# Patient Record
Sex: Female | Born: 1955 | Race: White | Hispanic: No | Smoking: Never smoker
Health system: Southern US, Community
[De-identification: ages and names within clinical notes are randomized; demographics above are authoritative.]

## PROBLEM LIST (undated history)

## (undated) ENCOUNTER — Emergency Department (HOSPITAL_COMMUNITY): Payer: BLUE CROSS/BLUE SHIELD | Source: Home / Self Care

## (undated) DIAGNOSIS — M1990 Unspecified osteoarthritis, unspecified site: Secondary | ICD-10-CM

## (undated) DIAGNOSIS — G43909 Migraine, unspecified, not intractable, without status migrainosus: Secondary | ICD-10-CM

## (undated) DIAGNOSIS — B019 Varicella without complication: Secondary | ICD-10-CM

## (undated) DIAGNOSIS — K900 Celiac disease: Secondary | ICD-10-CM

## (undated) DIAGNOSIS — E785 Hyperlipidemia, unspecified: Secondary | ICD-10-CM

## (undated) HISTORY — DX: Varicella without complication: B01.9

## (undated) HISTORY — DX: Migraine, unspecified, not intractable, without status migrainosus: G43.909

## (undated) HISTORY — DX: Celiac disease: K90.0

## (undated) HISTORY — DX: Hyperlipidemia, unspecified: E78.5

## (undated) HISTORY — DX: Unspecified osteoarthritis, unspecified site: M19.90

---
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
## 1998-09-17 ENCOUNTER — Other Ambulatory Visit: Admission: RE | Admit: 1998-09-17 | Discharge: 1998-09-17 | Payer: Self-pay | Admitting: *Deleted

## 1999-11-25 ENCOUNTER — Other Ambulatory Visit: Admission: RE | Admit: 1999-11-25 | Discharge: 1999-11-25 | Payer: Self-pay | Admitting: *Deleted

## 2000-11-26 ENCOUNTER — Other Ambulatory Visit: Admission: RE | Admit: 2000-11-26 | Discharge: 2000-11-26 | Payer: Self-pay | Admitting: *Deleted

## 2001-12-03 ENCOUNTER — Other Ambulatory Visit: Admission: RE | Admit: 2001-12-03 | Discharge: 2001-12-03 | Payer: Self-pay | Admitting: Obstetrics and Gynecology

## 2003-01-10 ENCOUNTER — Other Ambulatory Visit: Admission: RE | Admit: 2003-01-10 | Discharge: 2003-01-10 | Payer: Self-pay | Admitting: Obstetrics and Gynecology

## 2003-07-17 ENCOUNTER — Encounter: Admission: RE | Admit: 2003-07-17 | Discharge: 2003-10-15 | Payer: Self-pay | Admitting: Gastroenterology

## 2004-01-16 ENCOUNTER — Other Ambulatory Visit: Admission: RE | Admit: 2004-01-16 | Discharge: 2004-01-16 | Payer: Self-pay | Admitting: Obstetrics and Gynecology

## 2004-09-29 HISTORY — PX: CARPAL TUNNEL RELEASE: SHX101

## 2004-12-09 ENCOUNTER — Encounter: Admission: RE | Admit: 2004-12-09 | Discharge: 2004-12-09 | Payer: Self-pay | Admitting: Obstetrics and Gynecology

## 2004-12-10 ENCOUNTER — Ambulatory Visit (HOSPITAL_COMMUNITY): Admission: RE | Admit: 2004-12-10 | Discharge: 2004-12-10 | Payer: Self-pay | Admitting: Neurosurgery

## 2005-02-19 ENCOUNTER — Ambulatory Visit (HOSPITAL_COMMUNITY): Admission: RE | Admit: 2005-02-19 | Discharge: 2005-02-20 | Payer: Self-pay | Admitting: Neurosurgery

## 2005-03-28 ENCOUNTER — Ambulatory Visit (HOSPITAL_COMMUNITY): Admission: RE | Admit: 2005-03-28 | Discharge: 2005-03-28 | Payer: Self-pay | Admitting: Neurosurgery

## 2013-09-29 HISTORY — PX: RETINAL TEAR REPAIR CRYOTHERAPY: SHX5304

## 2015-07-02 ENCOUNTER — Other Ambulatory Visit: Payer: Self-pay | Admitting: Obstetrics & Gynecology

## 2015-07-02 DIAGNOSIS — Z1239 Encounter for other screening for malignant neoplasm of breast: Secondary | ICD-10-CM

## 2015-07-12 ENCOUNTER — Ambulatory Visit
Admission: RE | Admit: 2015-07-12 | Discharge: 2015-07-12 | Disposition: A | Discharge status: Home / Self Care | Payer: BLUE CROSS/BLUE SHIELD | Source: Ambulatory Visit | Attending: Obstetrics & Gynecology | Admitting: Obstetrics & Gynecology

## 2015-07-12 DIAGNOSIS — Z1231 Encounter for screening mammogram for malignant neoplasm of breast: Secondary | ICD-10-CM | POA: Insufficient documentation

## 2015-07-12 DIAGNOSIS — Z1239 Encounter for other screening for malignant neoplasm of breast: Secondary | ICD-10-CM

## 2016-05-07 ENCOUNTER — Encounter: Payer: Self-pay | Admitting: Primary Care

## 2016-05-07 ENCOUNTER — Ambulatory Visit (INDEPENDENT_AMBULATORY_CARE_PROVIDER_SITE_OTHER): Payer: BLUE CROSS/BLUE SHIELD | Admitting: Primary Care

## 2016-05-07 VITALS — BP 124/84 | HR 82 | Temp 97.9°F | Ht 63.0 in | Wt 158.8 lb

## 2016-05-07 DIAGNOSIS — R5383 Other fatigue: Secondary | ICD-10-CM | POA: Diagnosis not present

## 2016-05-07 DIAGNOSIS — K900 Celiac disease: Secondary | ICD-10-CM | POA: Diagnosis not present

## 2016-05-07 DIAGNOSIS — E785 Hyperlipidemia, unspecified: Secondary | ICD-10-CM

## 2016-05-07 NOTE — Assessment & Plan Note (Signed)
Diagnosed in 2004 and is managed by GI through [facility] and also a specialist through [facility]. Must have gluten-free medications, cannot take steroids. Follows a strict gluten diet.

## 2016-05-07 NOTE — Progress Notes (Signed)
Subjective:    Patient ID: Paula Gray, female    DOB: 09-11-1956, 60 y.o.   MRN: [id number]  HPI  Paula Gray is a 60 year old female who presents today to establish care and discuss the problems mentioned below. Will obtain old records. Her last physical was in May 2016.  1) Hyperlipidemia: Diagnosed in May 2016. Currently managed on Atorvastatin 20 mg for which she now takes 1/2 tablet (10 mg) as the full tablet caused dizziness.  2) Celiac Disease: Diagnosed in 2004. She currently manages with hematology and GI through [facility]. She also see's a specialist through [facility] for Celiac Disease. She follows a Celiac controlled diet. She experiences fatigue daily, some days more drastic than others. She does work in a high stress occupation which could likely be contributing. Denies symptoms of depression. She will sleep 6-8 hours of sleep most nights and will sometimes feel well rested. Denies snoring, waking up gasping for air in the night, family history of sleep apnea.  3) Anemia: History of iron deficiency anemia secondary to celiac disease. She will require IV iron infusions for which she will obtain from [facility]. Recent CBC and Iron panel stable in August 2017.  Review of Systems  Constitutional: Positive for fatigue.  Cardiovascular: Negative for palpitations.  Gastrointestinal: Negative for abdominal pain.  Musculoskeletal: Negative for myalgias.  Neurological: Negative for dizziness and headaches.  Psychiatric/Behavioral: Negative for sleep disturbance. The patient is not nervous/anxious.        Past Medical History:  Diagnosis Date  . Arthritis   . Celiac disease   . Chickenpox   . Hyperlipidemia   . Migraine      Social History   Social History  . Marital status: Married    Spouse name: N/A  . Number of children: N/A  . Years of education: N/A   Occupational History  . Not on file.   Social History Main Topics  . Smoking status: Never Smoker  .  Smokeless tobacco: Never Used  . Alcohol use Yes     Comment: social  . Drug use: No  . Sexual activity: Not on file   Other Topics Concern  . Not on file   Social History Narrative   Single.    2 children. 1 grandchild.   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     Past Surgical History:  Procedure Laterality Date  . CARPAL TUNNEL RELEASE  2006  . CESAREAN SECTION  1985  . RETINAL TEAR REPAIR CRYOTHERAPY Left 2015  . TONSILLECTOMY  1962    Family History  Problem Relation Age of Onset  . Breast cancer Maternal Aunt     great aunt  . Arthritis Maternal Grandmother   . Heart disease Maternal Grandfather   . Arthritis Paternal Grandmother   . Colon cancer Paternal Grandfather   . Skin cancer Father     Allergies  Allergen Reactions  . Iron Dextran     No current outpatient prescriptions on file prior to visit.   No current facility-administered medications on file prior to visit.     BP 124/84   Pulse 82   Temp 97.9 F (36.6 C) (Oral)   Ht 5\' 3"  (1.6 m)   Wt 158 lb 12.8 oz (72 kg)   SpO2 98%   BMI 28.13 kg/m    Objective:   Physical Exam  Constitutional: She appears well-nourished.  Neck: Neck supple.  Cardiovascular: Normal  rate and regular rhythm.   Pulmonary/Chest: Effort normal and breath sounds normal.  Skin: Skin is warm and dry.  Psychiatric: She has a normal mood and affect.          Assessment & Plan:  Fatigue:  Ongoing for several months, works in a high stress job. History of celiac disease requiring IV iron transfusions due to anemia secondary to disease. TSH 1 year ago stable. Does not seem to be related to mood disorder or sleep apnea. Will check TSH and vitamin levels to rule out metabolic cause. CBC in early August unremarkable per care everywhere. Likely due to high stress job, discussed stress reduction techniques.  Sheral Flow, NP

## 2016-05-07 NOTE — Assessment & Plan Note (Signed)
Diagnosis 1 year ago and placed on atorvastatin 20 mg. She takes one half tablet due to dizziness with full dose. Will repeat lipids and LFTs at upcoming physical.

## 2016-05-07 NOTE — Patient Instructions (Signed)
Complete lab work prior to leaving today. I will notify you of your results once received.   Please schedule a physical with me at your convenience. You may also schedule a lab only appointment 3-4 days prior. We will discuss your lab results in detail during your physical.  It was a pleasure to meet you today! Please don't hesitate to call me with any questions. Welcome to [place]!   

## 2016-05-07 NOTE — Progress Notes (Signed)
Pre visit review using our clinic review tool, if applicable. No additional management support is needed unless otherwise documented below in the visit note. 

## 2016-05-08 ENCOUNTER — Telehealth: Payer: Self-pay | Admitting: Primary Care

## 2016-05-08 ENCOUNTER — Encounter: Payer: Self-pay | Admitting: *Deleted

## 2016-05-08 LAB — COMPREHENSIVE METABOLIC PANEL
ALK PHOS: 71 U/L (ref 39–117)
ALT: 21 U/L (ref 0–35)
AST: 15 U/L (ref 0–37)
Albumin: 4.8 g/dL (ref 3.5–5.2)
BUN: 17 mg/dL (ref 6–23)
CALCIUM: 9.9 mg/dL (ref 8.4–10.5)
CHLORIDE: 103 meq/L (ref 96–112)
CO2: 30 mEq/L (ref 19–32)
CREATININE: 0.83 mg/dL (ref 0.40–1.20)
GFR: 74.83 mL/min (ref >60.00)
Glucose, Bld: 101 mg/dL — ABNORMAL HIGH (ref 70–99)
POTASSIUM: 4.6 meq/L (ref 3.5–5.1)
Sodium: 139 mEq/L (ref 135–145)
Total Bilirubin: 0.7 mg/dL (ref 0.2–1.2)
Total Protein: 7.6 g/dL (ref 6.0–8.3)

## 2016-05-08 LAB — TSH: TSH: 3.54 u[IU]/mL (ref 0.35–4.50)

## 2016-05-08 LAB — VITAMIN B12: VITAMIN B 12: 464 pg/mL (ref 211–911)

## 2016-05-08 LAB — VITAMIN D 25 HYDROXY (VIT D DEFICIENCY, FRACTURES): VITD: 20.52 ng/mL — AB (ref 30.00–100.00)

## 2016-05-08 NOTE — Telephone Encounter (Signed)
Notified patient of Kate's comments and lab results. Patient verbalized understanding.

## 2016-05-08 NOTE — Telephone Encounter (Signed)
Patient returned Chan's call. °

## 2016-05-23 ENCOUNTER — Ambulatory Visit (INDEPENDENT_AMBULATORY_CARE_PROVIDER_SITE_OTHER): Payer: BLUE CROSS/BLUE SHIELD | Admitting: Primary Care

## 2016-05-23 ENCOUNTER — Other Ambulatory Visit: Payer: Self-pay | Admitting: Primary Care

## 2016-05-23 ENCOUNTER — Encounter: Payer: Self-pay | Admitting: Primary Care

## 2016-05-23 VITALS — BP 122/84 | HR 78 | Temp 97.5°F | Ht 63.0 in | Wt 157.1 lb

## 2016-05-23 DIAGNOSIS — Z23 Encounter for immunization: Secondary | ICD-10-CM | POA: Diagnosis not present

## 2016-05-23 DIAGNOSIS — Z0000 Encounter for general adult medical examination without abnormal findings: Secondary | ICD-10-CM | POA: Diagnosis not present

## 2016-05-23 DIAGNOSIS — E785 Hyperlipidemia, unspecified: Secondary | ICD-10-CM

## 2016-05-23 LAB — LDL CHOLESTEROL, DIRECT: LDL DIRECT: 118 mg/dL

## 2016-05-23 LAB — LIPID PANEL
CHOL/HDL RATIO: 5
Cholesterol: 190 mg/dL (ref 0–200)
HDL: 37.5 mg/dL — ABNORMAL LOW (ref >39.00)
NonHDL: 152.52
Triglycerides: 212 mg/dL — ABNORMAL HIGH (ref 0.0–149.0)
VLDL: 42.4 mg/dL — AB (ref 0.0–40.0)

## 2016-05-23 MED ORDER — ATORVASTATIN CALCIUM 20 MG PO TABS: 10.0000 mg | ORAL_TABLET | Freq: Every day | ORAL | 3 refills | Status: DC

## 2016-05-23 NOTE — Progress Notes (Signed)
Pre visit review using our clinic review tool, if applicable. No additional management support is needed unless otherwise documented below in the visit note. 

## 2016-05-23 NOTE — Assessment & Plan Note (Signed)
Tetanus due, provided today. Pap UTD, follows with GYN. Mammogram due in October, patient to schedule. Colonoscopy UTD. Overall fair diet. Encouraged regular exercise. Exam unremarkable. Labs pending. Follow up in 1 year for repeat physical.

## 2016-05-23 NOTE — Progress Notes (Signed)
Subjective:    Patient ID: Paula Gray, female    DOB: August 04, 1956, 60 y.o.   MRN: [id number]  HPI  Paula Gray is a 60 year old female who presents today for complete physical.  Immunizations: -Tetanus: Completed over 10 years ago. Due today. -Influenza: Did not complete last season.  Diet: Endorses a healthy diet.  Breakfast: Oatmeal with Agave, coffee Lunch: Fast food, salad Dinner: Vegetables, chicken, fish, occasional red meat Snacks: Fruit Desserts: Occasionally Beverages: Water, coffee, unsweet tea, [place]  Exercise: She does not currently exercise, active in her home Eye exam: Completed 1 year ago, sees a retina specialist Dental exam: Completes semi-annually Colonoscopy: Completed 2 years ago, history of Celiac disease Pap Smear: Completed 2 years ago, follows with GYN. Mammogram: Completed in 2016, due in October 2017   Review of Systems  Constitutional: Negative for unexpected weight change.  HENT: Negative for rhinorrhea.   Respiratory: Negative for cough and shortness of breath.   Cardiovascular: Negative for chest pain.  Gastrointestinal: Negative for constipation and diarrhea.  Genitourinary: Negative for difficulty urinating.  Musculoskeletal: Negative for arthralgias and myalgias.  Skin: Negative for rash.  Allergic/Immunologic: Negative for environmental allergies.  Neurological: Negative for dizziness, numbness and headaches.  Psychiatric/Behavioral:       Denies concerns for anxiety or depression       Past Medical History:  Diagnosis Date  . Arthritis   . Celiac disease   . Chickenpox   . Hyperlipidemia   . Migraine      Social History   Social History  . Marital status: Married    Spouse name: N/A  . Number of children: N/A  . Years of education: N/A   Occupational History  . Not on file.   Social History Main Topics  . Smoking status: Never Smoker  . Smokeless tobacco: Never Used  . Alcohol use Yes     Comment: social  .  Drug use: No  . Sexual activity: Not on file   Other Topics Concern  . Not on file   Social History Narrative   Single.    2 children. 1 grandchild.   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     Past Surgical History:  Procedure Laterality Date  . CARPAL TUNNEL RELEASE  2006  . CESAREAN SECTION  1985  . RETINAL TEAR REPAIR CRYOTHERAPY Left 2015  . TONSILLECTOMY  1962    Family History  Problem Relation Age of Onset  . Breast cancer Maternal Aunt     great aunt  . Arthritis Maternal Grandmother   . Heart disease Maternal Grandfather   . Arthritis Paternal Grandmother   . Colon cancer Paternal Grandfather   . Skin cancer Father     Allergies  Allergen Reactions  . Iron Dextran   . Prednisone Other (See Comments)    History of celiac disease    Current Outpatient Prescriptions on File Prior to Visit  Medication Sig Dispense Refill  . atorvastatin (LIPITOR) 20 MG tablet Take 10 mg by mouth daily.     . Cholecalciferol (VITAMIN D3) 1000 units CAPS Take by mouth.    . CHONDROITIN SULFATE PO Take 1,200 mg by mouth daily.    . Glucosamine-Chondroit-Vit C-Mn (GLUCOSAMINE 1500 COMPLEX PO) Take by mouth.     No current facility-administered medications on file prior to visit.     BP 122/84   Pulse 78   Temp 97.5 F (36.4 C) (Oral)  Ht 5\' 3"  (1.6 m)   Wt 157 lb 1.9 oz (71.3 kg)   SpO2 98%   BMI 27.83 kg/m    Objective:   Physical Exam  Constitutional: She is oriented to person, place, and time. She appears well-nourished.  HENT:  Right Ear: Tympanic membrane and ear canal normal.  Left Ear: Tympanic membrane and ear canal normal.  Nose: Nose normal.  Mouth/Throat: Oropharynx is clear and moist.  Eyes: Conjunctivae and EOM are normal. Pupils are equal, round, and reactive to light.  Neck: Neck supple. No thyromegaly present.  Cardiovascular: Normal rate and regular rhythm.   No murmur heard. Pulmonary/Chest: Effort normal and  breath sounds normal. She has no rales.  Abdominal: Soft. Bowel sounds are normal. There is no tenderness.  Musculoskeletal: Normal range of motion.  Lymphadenopathy:    She has no cervical adenopathy.  Neurological: She is alert and oriented to person, place, and time. She has normal reflexes. No cranial nerve deficit.  Skin: Skin is warm and dry. No rash noted.  Psychiatric: She has a normal mood and affect.          Assessment & Plan:

## 2016-05-23 NOTE — Addendum Note (Signed)
Addended by: Jacqualin Combes on: 05/23/2016 10:07 AM   Modules accepted: Orders

## 2016-05-23 NOTE — Assessment & Plan Note (Signed)
Managed on atorvastatin (gluten free) 20 mg and takes 1/2 tablet. Needing refills, labs pending. Denies myalgias.

## 2016-05-23 NOTE — Patient Instructions (Addendum)
Complete lab work prior to leaving today. I will notify you of your results once received.   You were provided with a tetanus vaccination which will cover you for 10 years.  Continue your healthy diet. Start exercising. You should be getting 1 hour of moderate intensity exercise 3-5 days weekly.  Schedule your mammogram in October.   Schedule a lab only appointment in 3 months for re-evaluation of vitamin D levels.  Follow up in 1 year for repeat physical or sooner if needed.  It was a pleasure to see you today!

## 2016-07-15 ENCOUNTER — Telehealth: Payer: Self-pay

## 2016-07-15 DIAGNOSIS — R682 Dry mouth, unspecified: Secondary | ICD-10-CM

## 2016-07-15 DIAGNOSIS — H04123 Dry eye syndrome of bilateral lacrimal glands: Secondary | ICD-10-CM

## 2016-07-15 NOTE — Telephone Encounter (Signed)
Pt having dryness in mouth, gums shrinking,(pt has seen a dentist),dry eyes and skin is dry as well. Pt wanted to know if [facility] lab could do testing for Sjorgren's syndrome. Terri in lab said yes they do that type testing. Pt would like note sent to Anda Kraft since had this problem for awhile and pt had annual 05/23/16. Pt request cb.

## 2016-07-15 NOTE — Telephone Encounter (Signed)
Please find out more information:  1. How long has she had this problem? 2. What did the dentist say during her visit? 3. Has she had any testing for this in the past? 4. Has anyone every suggested Sjogrens as a diagnosis?

## 2016-07-16 NOTE — Telephone Encounter (Signed)
Lab appt has been schedule on 07/21/2016.

## 2016-07-16 NOTE — Telephone Encounter (Signed)
Please notify patient that i've placed orders for her to come in a get labs.  I will notify her of the results once received.

## 2016-07-16 NOTE — Telephone Encounter (Signed)
Called patient.  1. This problem has be going on years.  2. The dentist stated that patient may be brushing too hard and it may lead to gum shrinking. Patient did have gum surgery to help increase gum line but it has decrease some since then.  3. No, she never had any testing done.  4. Someone in her support group with similar symptoms suggested the test for Sjgren's syndrome.  Patient stated that she is also been more tired even with supplement of vitamin D. Patient is having trouble sleeping.

## 2016-07-16 NOTE — Telephone Encounter (Signed)
Patient called.  Please call patient back at [phone].

## 2016-07-21 ENCOUNTER — Other Ambulatory Visit (INDEPENDENT_AMBULATORY_CARE_PROVIDER_SITE_OTHER): Payer: BLUE CROSS/BLUE SHIELD

## 2016-07-21 ENCOUNTER — Other Ambulatory Visit: Payer: Self-pay | Admitting: Obstetrics & Gynecology

## 2016-07-21 DIAGNOSIS — H04123 Dry eye syndrome of bilateral lacrimal glands: Secondary | ICD-10-CM | POA: Diagnosis not present

## 2016-07-21 DIAGNOSIS — R682 Dry mouth, unspecified: Secondary | ICD-10-CM | POA: Diagnosis not present

## 2016-07-21 DIAGNOSIS — Z1231 Encounter for screening mammogram for malignant neoplasm of breast: Secondary | ICD-10-CM

## 2016-07-22 LAB — CBC WITH DIFFERENTIAL/PLATELET
BASOS ABS: 0.1 10*3/uL (ref 0.0–0.1)
Basophils Relative: 1.1 % (ref 0.0–3.0)
EOS ABS: 0.3 10*3/uL (ref 0.0–0.7)
Eosinophils Relative: 4.3 % (ref 0.0–5.0)
HEMATOCRIT: 39.3 % (ref 36.0–46.0)
HEMOGLOBIN: 13.4 g/dL (ref 12.0–15.0)
LYMPHS PCT: 46 % (ref 12.0–46.0)
Lymphs Abs: 2.8 10*3/uL (ref 0.7–4.0)
MCHC: 34.1 g/dL (ref 30.0–36.0)
MCV: 91 fl (ref 78.0–100.0)
Monocytes Absolute: 0.4 10*3/uL (ref 0.1–1.0)
Monocytes Relative: 6.4 % (ref 3.0–12.0)
NEUTROS ABS: 2.6 10*3/uL (ref 1.4–7.7)
Neutrophils Relative %: 42.2 % — ABNORMAL LOW (ref 43.0–77.0)
PLATELETS: 184 10*3/uL (ref 150.0–400.0)
RBC: 4.32 Mil/uL (ref 3.87–5.11)
RDW: 13 % (ref 11.5–15.5)
WBC: 6.1 10*3/uL (ref 4.0–10.5)

## 2016-07-22 LAB — SEDIMENTATION RATE: Sed Rate: 4 mm/h (ref 0–30)

## 2016-07-28 ENCOUNTER — Other Ambulatory Visit: Payer: Self-pay | Admitting: *Deleted

## 2016-07-28 MED ORDER — ATORVASTATIN CALCIUM 20 MG PO TABS: 10.0000 mg | ORAL_TABLET | Freq: Every day | ORAL | 2 refills | Status: DC

## 2016-07-28 NOTE — Telephone Encounter (Signed)
Received faxed request from pharmacy stating that patient is requesting a 90 day supply of Atorvastatin. Refill sent to pharmacy electronically,

## 2016-08-05 ENCOUNTER — Ambulatory Visit
Admission: RE | Admit: 2016-08-05 | Discharge: 2016-08-05 | Disposition: A | Discharge status: Home / Self Care | Payer: BLUE CROSS/BLUE SHIELD | Source: Ambulatory Visit | Attending: Obstetrics & Gynecology | Admitting: Obstetrics & Gynecology

## 2016-08-05 DIAGNOSIS — Z1231 Encounter for screening mammogram for malignant neoplasm of breast: Secondary | ICD-10-CM | POA: Insufficient documentation

## 2017-03-06 ENCOUNTER — Other Ambulatory Visit: Payer: Self-pay

## 2017-03-06 MED ORDER — ATORVASTATIN CALCIUM 20 MG PO TABS: 10.0000 mg | ORAL_TABLET | Freq: Every day | ORAL | 0 refills | Status: DC

## 2017-03-06 NOTE — Telephone Encounter (Signed)
Pt has changed prescription companies and request new rx for atorvastatin to [facility].pt notified done. Last annual 05/26/16 and pt will cb to schedule annual exam.

## 2017-06-12 ENCOUNTER — Encounter: Payer: BLUE CROSS/BLUE SHIELD | Admitting: Primary Care

## 2017-06-19 ENCOUNTER — Ambulatory Visit (INDEPENDENT_AMBULATORY_CARE_PROVIDER_SITE_OTHER): Payer: BLUE CROSS/BLUE SHIELD | Admitting: Primary Care

## 2017-06-19 ENCOUNTER — Encounter: Payer: Self-pay | Admitting: Primary Care

## 2017-06-19 VITALS — BP 120/84 | HR 78 | Temp 97.8°F | Ht 63.0 in | Wt 151.4 lb

## 2017-06-19 DIAGNOSIS — D509 Iron deficiency anemia, unspecified: Secondary | ICD-10-CM

## 2017-06-19 DIAGNOSIS — K900 Celiac disease: Secondary | ICD-10-CM

## 2017-06-19 DIAGNOSIS — Z0000 Encounter for general adult medical examination without abnormal findings: Secondary | ICD-10-CM

## 2017-06-19 DIAGNOSIS — E785 Hyperlipidemia, unspecified: Secondary | ICD-10-CM | POA: Diagnosis not present

## 2017-06-19 LAB — COMPREHENSIVE METABOLIC PANEL
ALK PHOS: 54 U/L (ref 39–117)
ALT: 21 U/L (ref 0–35)
AST: 18 U/L (ref 0–37)
Albumin: 4.3 g/dL (ref 3.5–5.2)
BILIRUBIN TOTAL: 0.5 mg/dL (ref 0.2–1.2)
BUN: 16 mg/dL (ref 6–23)
CO2: 29 mEq/L (ref 19–32)
Calcium: 9.6 mg/dL (ref 8.4–10.5)
Chloride: 107 mEq/L (ref 96–112)
Creatinine, Ser: 0.85 mg/dL (ref 0.40–1.20)
GFR: 72.52 mL/min (ref >60.00)
GLUCOSE: 121 mg/dL — AB (ref 70–99)
Potassium: 4.7 mEq/L (ref 3.5–5.1)
SODIUM: 141 meq/L (ref 135–145)
TOTAL PROTEIN: 6.8 g/dL (ref 6.0–8.3)

## 2017-06-19 LAB — LIPID PANEL
Cholesterol: 173 mg/dL (ref 0–200)
HDL: 43.6 mg/dL (ref >39.00)
LDL Cholesterol: 99 mg/dL (ref 0–99)
NONHDL: 129.89
Total CHOL/HDL Ratio: 4
Triglycerides: 156 mg/dL — ABNORMAL HIGH (ref 0.0–149.0)
VLDL: 31.2 mg/dL (ref 0.0–40.0)

## 2017-06-19 LAB — CBC
HCT: 42.8 % (ref 36.0–46.0)
Hemoglobin: 14.6 g/dL (ref 12.0–15.0)
MCHC: 34 g/dL (ref 30.0–36.0)
MCV: 92 fl (ref 78.0–100.0)
PLATELETS: 209 10*3/uL (ref 150.0–400.0)
RBC: 4.66 Mil/uL (ref 3.87–5.11)
RDW: 13.1 % (ref 11.5–15.5)
WBC: 5.4 10*3/uL (ref 4.0–10.5)

## 2017-06-19 LAB — VITAMIN D 25 HYDROXY (VIT D DEFICIENCY, FRACTURES): VITD: 33.59 ng/mL (ref 30.00–100.00)

## 2017-06-19 LAB — FERRITIN: FERRITIN: 101.9 ng/mL (ref 10.0–291.0)

## 2017-06-19 LAB — VITAMIN B12: Vitamin B-12: >1500 pg/mL — ABNORMAL HIGH (ref 211–911)

## 2017-06-19 NOTE — Assessment & Plan Note (Signed)
Stable, following with GI through [facility].

## 2017-06-19 NOTE — Progress Notes (Signed)
Subjective:    Patient ID: Paula Gray, female    DOB: [birth year], 61 y.o.   MRN: [id number]  HPI  [name] is a 61 year old female who presents today for complete physical.  Immunizations: -Tetanus: Completed in 2017 -Influenza: Will get at work  Overall doing well except for chronic fatigue. Is requesting IM vitamin B 12 injections. Typically gets iron infusions per [facility] hematology, last ferritin was stable in July 2018 therefore no infusion was administered. She is wanting IM B 12 to "hold her over" until January when she can afford another infusion. Currently taking oral B 12 1000 mcg daily.   Diet: She endorses a healthy diet. Breakfast: Oatmeal, eggs Lunch: Unsure Dinner: Meat, vegetable Snacks: Cheese stick, fruit, yogurt Desserts: Occasionally Beverages: Water, diet soda, coffee  Exercise: She does not currently exercise.  Eye exam: Completed 1-2 years ago. Dental exam: Completes annually Colonoscopy: Completed in 2014 Pap Smear: Unsure, following with GYN.  Mammogram: Completed in November 2017    Review of Systems  Constitutional: Negative for unexpected weight change.       Chronic fatigue  HENT: Negative for rhinorrhea.   Respiratory: Negative for cough and shortness of breath.   Cardiovascular: Negative for chest pain.  Gastrointestinal: Negative for constipation and diarrhea.  Genitourinary: Negative for difficulty urinating and menstrual problem.  Musculoskeletal: Negative for arthralgias and myalgias.  Skin: Negative for rash.  Allergic/Immunologic: Negative for environmental allergies.  Neurological: Negative for dizziness, numbness and headaches.  Psychiatric/Behavioral:       Denies concerns for anxiety or depression       Past Medical History:  Diagnosis Date  . Arthritis   . Celiac disease   . Chickenpox   . Hyperlipidemia   . Migraine      Social History   Social History  . Marital status: Married    Spouse name: N/A  .  Number of children: N/A  . Years of education: N/A   Occupational History  . Not on file.   Social History Main Topics  . Smoking status: Never Smoker  . Smokeless tobacco: Never Used  . Alcohol use Yes     Comment: social  . Drug use: No  . Sexual activity: Not on file   Other Topics Concern  . Not on file   Social History Narrative   Single.    2 children. 1 grandchild.   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     Past Surgical History:  Procedure Laterality Date  . CARPAL TUNNEL RELEASE  2006  . CESAREAN SECTION  1985  . RETINAL TEAR REPAIR CRYOTHERAPY Left 2015  . TONSILLECTOMY  1962    Family History  Problem Relation Age of Onset  . Breast cancer Maternal Aunt        great aunt  . Arthritis Maternal Grandmother   . Heart disease Maternal Grandfather   . Arthritis Paternal Grandmother   . Colon cancer Paternal Grandfather   . Skin cancer Father     Allergies  Allergen Reactions  . Iron Dextran   . Prednisone Other (See Comments)    History of celiac disease    Current Outpatient Prescriptions on File Prior to Visit  Medication Sig Dispense Refill  . atorvastatin (LIPITOR) 20 MG tablet Take 0.5 tablets (10 mg total) by mouth daily. 45 tablet 0  . Cholecalciferol (VITAMIN D3) 1000 units CAPS Take by mouth.    . CHONDROITIN SULFATE  PO Take 1,200 mg by mouth daily.    . Glucosamine-Chondroit-Vit C-Mn (GLUCOSAMINE 1500 COMPLEX PO) Take by mouth.     No current facility-administered medications on file prior to visit.     BP 120/84   Pulse 78   Temp 97.8 F (36.6 C) (Oral)   Ht 5\' 3"  (1.6 m)   Wt 151 lb 6.4 oz (68.7 kg)   SpO2 99%   BMI 26.82 kg/m    Objective:   Physical Exam  Constitutional: She is oriented to person, place, and time. She appears well-nourished.  HENT:  Right Ear: Tympanic membrane and ear canal normal.  Left Ear: Tympanic membrane and ear canal normal.  Nose: Nose normal.  Mouth/Throat:  Oropharynx is clear and moist.  Eyes: Pupils are equal, round, and reactive to light. Conjunctivae and EOM are normal.  Neck: Neck supple. No thyromegaly present.  Cardiovascular: Normal rate and regular rhythm.   No murmur heard. Pulmonary/Chest: Effort normal and breath sounds normal. She has no rales.  Abdominal: Soft. Bowel sounds are normal. There is no tenderness.  Musculoskeletal: Normal range of motion.  Lymphadenopathy:    She has no cervical adenopathy.  Neurological: She is alert and oriented to person, place, and time. She has normal reflexes. No cranial nerve deficit.  Skin: Skin is warm and dry. No rash noted.  Psychiatric: She has a normal mood and affect.          Assessment & Plan:

## 2017-06-19 NOTE — Assessment & Plan Note (Signed)
Following with hematology through [facility], last Ferritin level too high for iron infusion. Discussed that we will need to check B 12 levels today prior to discussing IM B 12. Also check CBC and ferritin.

## 2017-06-19 NOTE — Assessment & Plan Note (Signed)
Td UTD, will get influenza vaccination through work. Pap and mammogram UTD. Colonoscopy UTD.  Discussed the importance of a healthy diet and regular exercise in order for weight loss, and to reduce the risk of other medical problems. Exam unremarkable. Labs pending. Follow up in 1 year.

## 2017-06-19 NOTE — Patient Instructions (Signed)
Complete lab work prior to leaving today. I will notify you of your results once received.   Start exercising. You should be getting 150 minutes of moderate intensity exercise weekly.  Increase consumption of vegetables and fruit.  Ensure you are consuming 64 ounces of water daily.  Follow up in 1 year for your annual exam or sooner if needed.  It was a pleasure to see you today!

## 2017-06-19 NOTE — Assessment & Plan Note (Signed)
Due for repeat lipids today, continue gluten free atorvastatin.

## 2017-06-22 ENCOUNTER — Other Ambulatory Visit (INDEPENDENT_AMBULATORY_CARE_PROVIDER_SITE_OTHER): Payer: BLUE CROSS/BLUE SHIELD

## 2017-06-22 DIAGNOSIS — R739 Hyperglycemia, unspecified: Secondary | ICD-10-CM

## 2017-06-22 LAB — HEMOGLOBIN A1C: HEMOGLOBIN A1C: 6.1 % (ref 4.6–6.5)

## 2017-06-24 ENCOUNTER — Other Ambulatory Visit: Payer: Self-pay | Admitting: Primary Care

## 2017-07-21 ENCOUNTER — Other Ambulatory Visit: Payer: Self-pay | Admitting: Obstetrics & Gynecology

## 2017-07-21 DIAGNOSIS — Z1231 Encounter for screening mammogram for malignant neoplasm of breast: Secondary | ICD-10-CM

## 2017-09-09 ENCOUNTER — Ambulatory Visit
Admission: RE | Admit: 2017-09-09 | Discharge: 2017-09-09 | Disposition: A | Discharge status: Home / Self Care | Payer: BLUE CROSS/BLUE SHIELD | Source: Ambulatory Visit | Attending: Obstetrics & Gynecology | Admitting: Obstetrics & Gynecology

## 2017-09-09 DIAGNOSIS — Z1231 Encounter for screening mammogram for malignant neoplasm of breast: Secondary | ICD-10-CM | POA: Insufficient documentation

## 2017-09-16 DIAGNOSIS — Z01419 Encounter for gynecological examination (general) (routine) without abnormal findings: Secondary | ICD-10-CM | POA: Diagnosis not present

## 2017-09-16 DIAGNOSIS — Z6826 Body mass index (BMI) 26.0-26.9, adult: Secondary | ICD-10-CM | POA: Diagnosis not present

## 2017-09-24 ENCOUNTER — Telehealth: Payer: Self-pay | Admitting: Primary Care

## 2017-09-24 DIAGNOSIS — D509 Iron deficiency anemia, unspecified: Secondary | ICD-10-CM

## 2017-09-24 DIAGNOSIS — R7303 Prediabetes: Secondary | ICD-10-CM

## 2017-09-24 NOTE — Telephone Encounter (Signed)
Any hematologist within the [facility] [facility]. We work closely with their team. Lab cancelled for A1C.

## 2017-09-24 NOTE — Telephone Encounter (Signed)
Spoken to patient. She states that she is not sure if her insurance will pay for the A1C so she would like to hold off on that.  Lab appt on 12/28 for the ferritin. Also patient wanted to know if Anda Kraft have nay suggestion for a hemologist in the [place] area. Please advise.

## 2017-09-24 NOTE — Telephone Encounter (Signed)
Okay to recheck ferritin. Please ask her if she's okay if I also check an A1C as she was in the prediabetic range in September 2018. Please schedule her for a lab only appointment.

## 2017-09-24 NOTE — Telephone Encounter (Signed)
Copied from [place] [id number]. Topic: Quick Communication - See Telephone Encounter >> Sep 24, 2017  8:36 AM Clack, Laban Emperor wrote: CRM for notification. See Telephone encounter for: Pt would like her PCP Clark to put a lab order in  to have her ferritin tested.  Please f/u with pt.  09/24/17.

## 2017-09-24 NOTE — Telephone Encounter (Signed)
Pt last annual and ferritin tested on 06/19/17; ferritin level 101.9.Please advise.

## 2017-09-25 ENCOUNTER — Other Ambulatory Visit: Payer: BLUE CROSS/BLUE SHIELD

## 2017-09-28 ENCOUNTER — Other Ambulatory Visit (INDEPENDENT_AMBULATORY_CARE_PROVIDER_SITE_OTHER): Payer: BLUE CROSS/BLUE SHIELD

## 2017-09-28 DIAGNOSIS — D509 Iron deficiency anemia, unspecified: Secondary | ICD-10-CM

## 2017-09-28 LAB — CBC
HEMATOCRIT: 42.4 % (ref 36.0–46.0)
HEMOGLOBIN: 14.3 g/dL (ref 12.0–15.0)
MCHC: 33.9 g/dL (ref 30.0–36.0)
MCV: 92.2 fl (ref 78.0–100.0)
Platelets: 193 10*3/uL (ref 150.0–400.0)
RBC: 4.59 Mil/uL (ref 3.87–5.11)
RDW: 13 % (ref 11.5–15.5)
WBC: 5.5 10*3/uL (ref 4.0–10.5)

## 2017-09-28 LAB — FERRITIN: FERRITIN: 117.1 ng/mL (ref 10.0–291.0)

## 2017-09-28 NOTE — Telephone Encounter (Signed)
Message left for patient to return my call.  

## 2017-09-28 NOTE — Telephone Encounter (Signed)
Pt called to return the phone call, call pt back if needed and pt states to leave a detailed message

## 2017-09-30 NOTE — Telephone Encounter (Signed)
Spoken and notified patient of Kate's comments. Patient verbalized understanding. 

## 2017-12-18 ENCOUNTER — Telehealth: Payer: Self-pay | Admitting: Primary Care

## 2017-12-18 DIAGNOSIS — D509 Iron deficiency anemia, unspecified: Secondary | ICD-10-CM

## 2017-12-18 NOTE — Telephone Encounter (Signed)
Noted, referral placed.  

## 2017-12-18 NOTE — Telephone Encounter (Signed)
Copied from [place] [id number]. Topic: Referral - Request >> Dec 18, 2017  3:44 PM Darl Householder, RMA wrote: Reason for CRM: Patient is requesting a referral for Hematology for iron infusions, please send to Dr. Grayland Ormond at [facility]

## 2017-12-18 NOTE — Telephone Encounter (Signed)
Pt last seen 06/19/17.Please advise.

## 2017-12-21 ENCOUNTER — Telehealth: Payer: Self-pay

## 2017-12-21 NOTE — Telephone Encounter (Signed)
Copied from [place] [id number]. Topic: Referral - Request >> Dec 18, 2017  3:44 PM Darl Householder, RMA wrote: Reason for CRM: Patient is requesting a referral for Hematology for iron infusions, please send to Dr. Grayland Ormond at [facility]  >> Dec 21, 2017  2:25 PM Bea Graff, NT wrote: Pt states the referral to the cancer center was not put in correctly per the cancer center. The girl at the cancer center gave her direct number: Beverlee Nims CB#: [phone]

## 2017-12-22 NOTE — Telephone Encounter (Signed)
Paula Gray called today to let the person that does the referral that she has received it but can not open it. That if she can edit it and put in [facility]. If there are any questions to call her back [phone], thanks.

## 2017-12-22 NOTE — Telephone Encounter (Signed)
Looked at this referral, when order was placed it did not get routed yet because we have not had a chance to look into this referral yet. I called Diane at [facility] and got this fixed and they will call patient now to get her set Paula Gray, RMA

## 2017-12-23 ENCOUNTER — Telehealth: Payer: Self-pay | Admitting: Primary Care

## 2017-12-23 NOTE — Telephone Encounter (Signed)
Copied from [place]. Topic: Quick Communication - See Telephone Encounter >> Dec 23, 2017  8:51 AM Ahmed Prima L wrote: CRM for notification. See Telephone encounter for: 12/23/17.  Colette from [facility] wants to know if she has had any recent labs other than December because those were normal. Please advise. Either call back or fax. Fax number is [phone] or phone [phone]

## 2017-12-23 NOTE — Telephone Encounter (Signed)
Spoken to [facility] and confirm the lab results in 08/2017. She stated that she will discuss with their provider and let patient know since patient's lab results are normal at this time.

## 2017-12-27 ENCOUNTER — Other Ambulatory Visit: Payer: Self-pay | Admitting: Primary Care

## 2017-12-30 NOTE — Progress Notes (Signed)
[place]  [phone] [phone]  ID: [name] OB: Mar 30, 1956  MR#: [id number]  TGY#:[id number]  Patient Care Team: Pleas Koch, NP as PCP - General (Internal Medicine)  CHIEF COMPLAINT: Iron deficiency anemia.  INTERVAL HISTORY: Patient is a 62 year old female with a long-standing history of iron deficiency anemia who is seen in clinic today after requesting a hematologist closer to home.  She has chronic weakness and fatigue, but otherwise feels well.  She has no neurologic complaints.  She denies any recent fevers or illnesses she has a good appetite and denies weight loss.  She denies any chest pain or shortness of breath.  She has no nausea, vomiting, constipation, or diarrhea.  She denies any melena or hematochezia.  She has no urinary complaints.  Patient offers no further specific complaints.  REVIEW OF SYSTEMS:   Review of Systems  Constitutional: Positive for malaise/fatigue. Negative for fever and weight loss.  Respiratory: Negative.  Negative for cough, hemoptysis and shortness of breath.   Cardiovascular: Negative.  Negative for chest pain and leg swelling.  Gastrointestinal: Negative.  Negative for abdominal pain, blood in stool and melena.  Genitourinary: Negative.  Negative for hematuria.  Musculoskeletal: Negative.   Skin: Negative.  Negative for rash.  Neurological: Positive for weakness. Negative for sensory change and focal weakness.  Psychiatric/Behavioral: Negative.  The patient is not nervous/anxious.     As per HPI. Otherwise, a complete review of systems is negative.  PAST MEDICAL HISTORY: Past Medical History:  Diagnosis Date  . Arthritis   . Celiac disease   . Chickenpox   . Hyperlipidemia   . Migraine     PAST SURGICAL HISTORY: Past Surgical History:  Procedure Laterality Date  . CARPAL TUNNEL RELEASE  2006  . CESAREAN SECTION  1985  . RETINAL TEAR REPAIR CRYOTHERAPY Left 2015  .  TONSILLECTOMY  1962    FAMILY HISTORY: Family History  Problem Relation Age of Onset  . Breast cancer Maternal Aunt        great aunt  . Arthritis Maternal Grandmother   . Heart disease Maternal Grandfather   . Arthritis Paternal Grandmother   . Colon cancer Paternal Grandfather   . Skin cancer Father     ADVANCED DIRECTIVES (Y/N):  N  HEALTH MAINTENANCE: Social History   Tobacco Use  . Smoking status: Never Smoker  . Smokeless tobacco: Never Used  Substance Use Topics  . Alcohol use: Yes    Comment: social  . Drug use: No     Colonoscopy:  PAP:  Bone density:  Lipid panel:  Allergies  Allergen Reactions  . Iron Dextran   . Prednisone Other (See Comments)    History of celiac disease    Current Outpatient Medications  Medication Sig Dispense Refill  . atorvastatin (LIPITOR) 20 MG tablet TAKE 0.5 TABLETS (10 MG TOTAL) BY MOUTH DAILY. 45 tablet 1  . Cholecalciferol (VITAMIN D3) 1000 units CAPS Take by mouth.    . CHONDROITIN SULFATE PO Take 1,200 mg by mouth daily.    . Glucosamine-Chondroit-Vit C-Mn (GLUCOSAMINE 1500 COMPLEX PO) Take by mouth.     No current facility-administered medications for this visit.     OBJECTIVE: Vitals:   12/31/17 1611  BP: (!) 170/97  Pulse: (!) 105  Resp: 18  Temp: 98.4 F (36.9 C)     Body mass index is 27.12 kg/m.    ECOG FS:0 - Asymptomatic  General: Well-developed, well-nourished, no acute distress. Eyes: Pink  conjunctiva, anicteric sclera. HEENT: Normocephalic, moist mucous membranes, clear oropharnyx. Lungs: Clear to auscultation bilaterally. Heart: Regular rate and rhythm. No rubs, murmurs, or gallops. Abdomen: Soft, nontender, nondistended. No organomegaly noted, normoactive bowel sounds. Musculoskeletal: No edema, cyanosis, or clubbing. Neuro: Alert, answering all questions appropriately. Cranial nerves grossly intact. Skin: No rashes or petechiae noted. Psych: Normal affect. Lymphatics: No cervical,  calvicular, axillary or inguinal LAD.   LAB RESULTS:  Lab Results  Component Value Date   NA 141 06/19/2017   K 4.7 06/19/2017   CL 107 06/19/2017   CO2 29 06/19/2017   GLUCOSE 121 (H) 06/19/2017   BUN 16 06/19/2017   CREATININE 0.85 06/19/2017   CALCIUM 9.6 06/19/2017   PROT 6.8 06/19/2017   ALBUMIN 4.3 06/19/2017   AST 18 06/19/2017   ALT 21 06/19/2017   ALKPHOS 54 06/19/2017   BILITOT 0.5 06/19/2017    Lab Results  Component Value Date   WBC 7.1 12/31/2017   NEUTROABS 4.7 12/31/2017   HGB 15.2 12/31/2017   HCT 45.7 12/31/2017   MCV 90.7 12/31/2017   PLT 230 12/31/2017   Lab Results  Component Value Date   IRON 48 12/31/2017   TIBC 385 12/31/2017   IRONPCTSAT 13 12/31/2017   Lab Results  Component Value Date   FERRITIN 121 12/31/2017     STUDIES: No results found.  ASSESSMENT: Iron deficiency anemia.  PLAN:   1. Iron deficiency anemia: Patient's hemoglobin and iron stores are within normal limits today.  Patient cannot remember the last time she received an iron infusion, but thinks it has been several years.  She reports that previously she required maintenance iron infusions.  She also reports that her previous hematologist would give her IV iron if her ferritin fell below 100 even if her hemoglobin was normal.  No intervention is needed at this time.  Patient's ferritin level is 121.  Return to clinic in 3 months with repeat laboratory work and further evaluation.  Approximately 45 minutes was spent in discussion of which greater than 50% was consultation.  Patient expressed understanding and was in agreement with this plan. She also understands that She can call clinic at any time with any questions, concerns, or complaints.    Lloyd Huger, MD   01/01/2018 12:45 PM

## 2017-12-31 ENCOUNTER — Inpatient Hospital Stay: Payer: BLUE CROSS/BLUE SHIELD | Attending: Oncology | Admitting: Oncology

## 2017-12-31 ENCOUNTER — Inpatient Hospital Stay: Payer: BLUE CROSS/BLUE SHIELD

## 2017-12-31 VITALS — BP 170/97 | HR 105 | Temp 98.4°F | Resp 18 | Ht 63.0 in | Wt 153.1 lb

## 2017-12-31 DIAGNOSIS — Z79899 Other long term (current) drug therapy: Secondary | ICD-10-CM | POA: Diagnosis not present

## 2017-12-31 DIAGNOSIS — K900 Celiac disease: Secondary | ICD-10-CM | POA: Diagnosis not present

## 2017-12-31 DIAGNOSIS — R5383 Other fatigue: Secondary | ICD-10-CM

## 2017-12-31 DIAGNOSIS — Z800 Family history of malignant neoplasm of digestive organs: Secondary | ICD-10-CM

## 2017-12-31 DIAGNOSIS — M129 Arthropathy, unspecified: Secondary | ICD-10-CM

## 2017-12-31 DIAGNOSIS — D509 Iron deficiency anemia, unspecified: Secondary | ICD-10-CM | POA: Diagnosis not present

## 2017-12-31 DIAGNOSIS — Z809 Family history of malignant neoplasm, unspecified: Secondary | ICD-10-CM

## 2017-12-31 DIAGNOSIS — R531 Weakness: Secondary | ICD-10-CM | POA: Diagnosis not present

## 2017-12-31 DIAGNOSIS — E785 Hyperlipidemia, unspecified: Secondary | ICD-10-CM

## 2017-12-31 DIAGNOSIS — Z803 Family history of malignant neoplasm of breast: Secondary | ICD-10-CM

## 2017-12-31 LAB — CBC WITH DIFFERENTIAL/PLATELET
Basophils Absolute: 0 10*3/uL (ref 0–0.1)
Basophils Relative: 0 %
EOS PCT: 1 %
Eosinophils Absolute: 0.1 10*3/uL (ref 0–0.7)
HCT: 45.7 % (ref 35.0–47.0)
Hemoglobin: 15.2 g/dL (ref 12.0–16.0)
LYMPHS ABS: 1.7 10*3/uL (ref 1.0–3.6)
LYMPHS PCT: 24 %
MCH: 30.2 pg (ref 26.0–34.0)
MCHC: 33.3 g/dL (ref 32.0–36.0)
MCV: 90.7 fL (ref 80.0–100.0)
MONO ABS: 0.5 10*3/uL (ref 0.2–0.9)
Monocytes Relative: 8 %
Neutro Abs: 4.7 10*3/uL (ref 1.4–6.5)
Neutrophils Relative %: 67 %
PLATELETS: 230 10*3/uL (ref 150–440)
RBC: 5.04 MIL/uL (ref 3.80–5.20)
RDW: 13 % (ref 11.5–14.5)
WBC: 7.1 10*3/uL (ref 3.6–11.0)

## 2017-12-31 LAB — IRON AND TIBC
IRON: 48 ug/dL (ref 28–170)
Saturation Ratios: 13 % (ref 10.4–31.8)
TIBC: 385 ug/dL (ref 250–450)
UIBC: 337 ug/dL

## 2017-12-31 LAB — FERRITIN: FERRITIN: 121 ng/mL (ref 11–307)

## 2018-01-01 ENCOUNTER — Telehealth: Payer: Self-pay | Admitting: *Deleted

## 2018-01-01 NOTE — Telephone Encounter (Signed)
Left vm for pt regarding lab results from visit yesterday. Per Dr. Grayland Ormond pts labs are all within normal range, keep f/u as planned.

## 2018-02-16 ENCOUNTER — Encounter: Payer: Self-pay | Admitting: Primary Care

## 2018-02-16 ENCOUNTER — Ambulatory Visit: Payer: BLUE CROSS/BLUE SHIELD | Admitting: Primary Care

## 2018-02-16 ENCOUNTER — Ambulatory Visit (INDEPENDENT_AMBULATORY_CARE_PROVIDER_SITE_OTHER)
Admission: RE | Admit: 2018-02-16 | Discharge: 2018-02-16 | Disposition: A | Discharge status: Home / Self Care | Payer: BLUE CROSS/BLUE SHIELD | Source: Ambulatory Visit | Attending: Primary Care | Admitting: Primary Care

## 2018-02-16 VITALS — BP 124/82 | HR 79 | Temp 97.9°F | Ht 63.0 in | Wt 155.2 lb

## 2018-02-16 DIAGNOSIS — M25561 Pain in right knee: Secondary | ICD-10-CM

## 2018-02-16 DIAGNOSIS — G8929 Other chronic pain: Secondary | ICD-10-CM | POA: Insufficient documentation

## 2018-02-16 NOTE — Progress Notes (Signed)
Subjective:    Patient ID: Paula Gray, female    DOB: [birth year], 62 y.o.   MRN: [id number]  HPI  Paula Gray is a 62 year old female who presents today with a chief complaint of knee pain.  Her pain is located to the right knee. She's been unable to kneel down onto her right knee due to discomfort for the past one year. One to two weeks ago she noticed swelling and increased in discomfort to the right knee. Her pain will radiate across her patella.   She denies trauma/injury, erythema, weakness, popping. She did notice some warmth last week. She's been taking 400 mg every 4-6 hours, not everyday with some improvement in swelling and discomfort. She did purchase a knee sleeve and has worn it a few times. She's never been evaluated for her knee in the past. If needed she'd like to see [employer]	[place], PA-C with orthopedics but is willing to start more conservatively first.   Review of Systems  Constitutional: Negative for fever.  Musculoskeletal: Positive for arthralgias.       Right knee pain and swelling  Skin: Negative for color change.  Neurological: Negative for weakness.       Past Medical History:  Diagnosis Date  . Arthritis   . Celiac disease   . Chickenpox   . Hyperlipidemia   . Migraine      Social History   Socioeconomic History  . Marital status: Divorced    Spouse name: Not on file  . Number of children: Not on file  . Years of education: Not on file  . Highest education level: Not on file  Occupational History  . Not on file  Social Needs  . Financial resource strain: Not on file  . Food insecurity:    Worry: Not on file    Inability: Not on file  . Transportation needs:    Medical: Not on file    Non-medical: Not on file  Tobacco Use  . Smoking status: Never Smoker  . Smokeless tobacco: Never Used  Substance and Sexual Activity  . Alcohol use: Yes    Comment: social  . Drug use: No  . Sexual activity: Not on file  Lifestyle  . Physical  activity:    Days per week: Not on file    Minutes per session: Not on file  . Stress: Not on file  Relationships  . Social connections:    Talks on phone: Not on file    Gets together: Not on file    Attends religious service: Not on file    Active member of club or organization: Not on file    Attends meetings of clubs or organizations: Not on file    Relationship status: Not on file  . Intimate partner violence:    Fear of current or ex partner: Not on file    Emotionally abused: Not on file    Physically abused: Not on file    Forced sexual activity: Not on file  Other Topics Concern  . Not on file  Social History Narrative   Single.    2 children. 1 grandchild.   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     Past Surgical History:  Procedure Laterality Date  . CARPAL TUNNEL RELEASE  2006  . CESAREAN SECTION  1985  . RETINAL TEAR REPAIR CRYOTHERAPY Left 2015  . TONSILLECTOMY  1962    Family History  Problem  Relation Age of Onset  . Breast cancer Maternal Aunt        great aunt  . Arthritis Maternal Grandmother   . Heart disease Maternal Grandfather   . Arthritis Paternal Grandmother   . Colon cancer Paternal Grandfather   . Skin cancer Father     Allergies  Allergen Reactions  . Iron Dextran   . Prednisone Other (See Comments)    History of celiac disease    Current Outpatient Medications on File Prior to Visit  Medication Sig Dispense Refill  . atorvastatin (LIPITOR) 20 MG tablet TAKE 0.5 TABLETS (10 MG TOTAL) BY MOUTH DAILY. 45 tablet 1  . Cholecalciferol (VITAMIN D3) 1000 units CAPS Take by mouth.    . CHONDROITIN SULFATE PO Take 1,200 mg by mouth daily.    . Glucosamine-Chondroit-Vit C-Mn (GLUCOSAMINE 1500 COMPLEX PO) Take by mouth.     No current facility-administered medications on file prior to visit.     BP 124/82   Pulse 79   Temp 97.9 F (36.6 C) (Oral)   Ht 5\' 3"  (1.6 m)   Wt 155 lb 4 oz (70.4 kg)   SpO2  97%   BMI 27.50 kg/m    Objective:   Physical Exam  Constitutional: She appears well-nourished.  Cardiovascular: Normal rate.  Pulmonary/Chest: Effort normal.  Musculoskeletal:       Right knee: She exhibits decreased range of motion and swelling. She exhibits no deformity, no erythema, no LCL laxity, no bony tenderness and no MCL laxity. Tenderness found. Medial joint line tenderness noted.       Legs: Skin: Skin is warm and dry. No erythema.          Assessment & Plan:

## 2018-02-16 NOTE — Patient Instructions (Signed)
Increase ibuprofen to 600 mg every 8 hours for pain and inflammation.   Resume wearing the knee sleeve as discussed.  Try the exercises below.   Ice the knee and elevate 2-3 times daily if possible.  Complete xray(s) prior to leaving today. I will notify you of your results once received.  Please notify me if no improvement in 3-4 days.  It was a pleasure to see you today!   Knee Exercises Ask your health care provider which exercises are safe for you. Do exercises exactly as told by your health care provider and adjust them as directed. It is normal to feel mild stretching, pulling, tightness, or discomfort as you do these exercises, but you should stop right away if you feel sudden pain or your pain gets worse.Do not begin these exercises until told by your health care provider. STRETCHING AND RANGE OF MOTION EXERCISES These exercises warm up your muscles and joints and improve the movement and flexibility of your knee. These exercises also help to relieve pain, numbness, and tingling. Exercise A: Knee Extension, Prone 1. Lie on your abdomen on a bed. 2. Place your left / right knee just beyond the edge of the surface so your knee is not on the bed. You can put a towel under your left / right thigh just above your knee for comfort. 3. Relax your leg muscles and allow gravity to straighten your knee. You should feel a stretch behind your left / right knee. 4. Hold this position for __________ seconds. 5. Scoot up so your knee is supported between repetitions. Repeat __________ times. Complete this stretch __________ times a day. Exercise B: Knee Flexion, Active  1. Lie on your back with both knees straight. If this causes back discomfort, bend your left / right knee so your foot is flat on the floor. 2. Slowly slide your left / right heel back toward your buttocks until you feel a gentle stretch in the front of your knee or thigh. 3. Hold this position for __________  seconds. 4. Slowly slide your left / right heel back to the starting position. Repeat __________ times. Complete this exercise __________ times a day. Exercise C: Quadriceps, Prone  1. Lie on your abdomen on a firm surface, such as a bed or padded floor. 2. Bend your left / right knee and hold your ankle. If you cannot reach your ankle or pant leg, loop a belt around your foot and grab the belt instead. 3. Gently pull your heel toward your buttocks. Your knee should not slide out to the side. You should feel a stretch in the front of your thigh and knee. 4. Hold this position for __________ seconds. Repeat __________ times. Complete this stretch __________ times a day. Exercise D: Hamstring, Supine 1. Lie on your back. 2. Loop a belt or towel over the ball of your left / right foot. The ball of your foot is on the walking surface, right under your toes. 3. Straighten your left / right knee and slowly pull on the belt to raise your leg until you feel a gentle stretch behind your knee. ? Do not let your left / right knee bend while you do this. ? Keep your other leg flat on the floor. 4. Hold this position for __________ seconds. Repeat __________ times. Complete this stretch __________ times a day. STRENGTHENING EXERCISES These exercises build strength and endurance in your knee. Endurance is the ability to use your muscles for a long time, even after  they get tired. Exercise E: Quadriceps, Isometric  1. Lie on your back with your left / right leg extended and your other knee bent. Put a rolled towel or small pillow under your knee if told by your health care provider. 2. Slowly tense the muscles in the front of your left / right thigh. You should see your kneecap slide up toward your hip or see increased dimpling just above the knee. This motion will push the back of the knee toward the floor. 3. For __________ seconds, keep the muscle as tight as you can without increasing your  pain. 4. Relax the muscles slowly and completely. Repeat __________ times. Complete this exercise __________ times a day. Exercise F: Straight Leg Raises - Quadriceps 1. Lie on your back with your left / right leg extended and your other knee bent. 2. Tense the muscles in the front of your left / right thigh. You should see your kneecap slide up or see increased dimpling just above the knee. Your thigh may even shake a bit. 3. Keep these muscles tight as you raise your leg 4-6 inches (10-15 cm) off the floor. Do not let your knee bend. 4. Hold this position for __________ seconds. 5. Keep these muscles tense as you lower your leg. 6. Relax your muscles slowly and completely after each repetition. Repeat __________ times. Complete this exercise __________ times a day. Exercise G: Hamstring, Isometric 1. Lie on your back on a firm surface. 2. Bend your left / right knee approximately __________ degrees. 3. Dig your left / right heel into the surface as if you are trying to pull it toward your buttocks. Tighten the muscles in the back of your thighs to dig as hard as you can without increasing any pain. 4. Hold this position for __________ seconds. 5. Release the tension gradually and allow your muscles to relax completely for __________ seconds after each repetition. Repeat __________ times. Complete this exercise __________ times a day. Exercise H: Hamstring Curls  If told by your health care provider, do this exercise while wearing ankle weights. Begin with __________ weights. Then increase the weight by 1 lb (0.5 kg) increments. Do not wear ankle weights that are more than __________. 1. Lie on your abdomen with your legs straight. 2. Bend your left / right knee as far as you can without feeling pain. Keep your hips flat against the floor. 3. Hold this position for __________ seconds. 4. Slowly lower your leg to the starting position.  Repeat __________ times. Complete this exercise  __________ times a day. Exercise I: Squats (Quadriceps) 1. Stand in front of a table, with your feet and knees pointing straight ahead. You may rest your hands on the table for balance but not for support. 2. Slowly bend your knees and lower your hips like you are going to sit in a chair. ? Keep your weight over your heels, not over your toes. ? Keep your lower legs upright so they are parallel with the table legs. ? Do not let your hips go lower than your knees. ? Do not bend lower than told by your health care provider. ? If your knee pain increases, do not bend as low. 3. Hold the squat position for __________ seconds. 4. Slowly push with your legs to return to standing. Do not use your hands to pull yourself to standing. Repeat __________ times. Complete this exercise __________ times a day. Exercise J: Wall Slides (Quadriceps)  1. Lean your back against a  smooth wall or door while you walk your feet out 18-24 inches (46-61 cm) from it. 2. Place your feet hip-width apart. 3. Slowly slide down the wall or door until your knees bend __________ degrees. Keep your knees over your heels, not over your toes. Keep your knees in line with your hips. 4. Hold for __________ seconds. Repeat __________ times. Complete this exercise __________ times a day. Exercise K: Straight Leg Raises - Hip Abductors 1. Lie on your side with your left / right leg in the top position. Lie so your head, shoulder, knee, and hip line up. You may bend your bottom knee to help you keep your balance. 2. Roll your hips slightly forward so your hips are stacked directly over each other and your left / right knee is facing forward. 3. Leading with your heel, lift your top leg 4-6 inches (10-15 cm). You should feel the muscles in your outer hip lifting. ? Do not let your foot drift forward. ? Do not let your knee roll toward the ceiling. 4. Hold this position for __________ seconds. 5. Slowly return your leg to the starting  position. 6. Let your muscles relax completely after each repetition. Repeat __________ times. Complete this exercise __________ times a day. Exercise L: Straight Leg Raises - Hip Extensors 1. Lie on your abdomen on a firm surface. You can put a pillow under your hips if that is more comfortable. 2. Tense the muscles in your buttocks and lift your left / right leg about 4-6 inches (10-15 cm). Keep your knee straight as you lift your leg. 3. Hold this position for __________ seconds. 4. Slowly lower your leg to the starting position. 5. Let your leg relax completely after each repetition. Repeat __________ times. Complete this exercise __________ times a day. This information is not intended to replace advice given to you by your health care provider. Make sure you discuss any questions you have with your health care provider. Document Released: 07/30/2005 Document Revised: 06/09/2016 Document Reviewed: 07/22/2015 Elsevier Interactive Patient Education  2018 [employer].

## 2018-02-16 NOTE — Assessment & Plan Note (Signed)
Chronic for the past one year, acute pain with inflammation over last 1-2 weeks.  Exam today without evidence of infection. Suspect bursitis likely from osteoarthritic flare.   Increase ibuprofen to 600 mg TID. Discussed use of ice and elevation. Resume using knee sleeve for support. Check plain films of knee today.  Consider PT if symptoms do not improve.

## 2018-02-23 ENCOUNTER — Encounter: Payer: Self-pay | Admitting: Emergency Medicine

## 2018-02-23 ENCOUNTER — Other Ambulatory Visit: Payer: Self-pay

## 2018-02-23 ENCOUNTER — Ambulatory Visit
Admission: EM | Admit: 2018-02-23 | Discharge: 2018-02-23 | Disposition: A | Discharge status: Home / Self Care | Payer: BLUE CROSS/BLUE SHIELD | Attending: Nurse Practitioner | Admitting: Nurse Practitioner

## 2018-02-23 DIAGNOSIS — J0100 Acute maxillary sinusitis, unspecified: Secondary | ICD-10-CM | POA: Diagnosis not present

## 2018-02-23 MED ORDER — AMOXICILLIN-POT CLAVULANATE 875-125 MG PO TABS: 1.0000 | ORAL_TABLET | Freq: Two times a day (BID) | ORAL | 0 refills | Status: DC

## 2018-02-23 NOTE — ED Provider Notes (Signed)
[facility]    CSN: [id number] Arrival date & time: 02/23/18  1720     History   Chief Complaint Chief Complaint  Patient presents with  . Conjunctivitis  . Facial Pain    HPI JODY AGUINAGA is a 62 y.o. female.   Subjective:   TUJUANA KILMARTIN is a 62 y.o. female who presents for evaluation of possible sinus infection. Symptoms include congestion, facial pain, right frontal headache, productive cough with  yellow colored sputum and sinus pressure with no fever, chills, night sweats or weight loss. Onset of symptoms was 4 days ago and has been unchanged since that time.  She is drinking plenty of fluids.  Past history is significant for no history of pneumonia or bronchitis. Patient is a non-smoker.  The following portions of the patient's history were reviewed and updated as appropriate: allergies, current medications, past family history, past medical history, past social history, past surgical history and problem list.       Past Medical History:  Diagnosis Date  . Arthritis   . Celiac disease   . Chickenpox   . Hyperlipidemia   . Migraine     Patient Active Problem List   Diagnosis Date Noted  . Chronic pain of right knee 02/16/2018  . Iron deficiency anemia 06/19/2017  . Preventative health care 05/23/2016  . Celiac disease 05/07/2016  . Hyperlipidemia 05/07/2016    Past Surgical History:  Procedure Laterality Date  . CARPAL TUNNEL RELEASE  2006  . CESAREAN SECTION  1985  . RETINAL TEAR REPAIR CRYOTHERAPY Left 2015  . TONSILLECTOMY  1962    OB History   None      Home Medications    Prior to Admission medications   Medication Sig Start Date End Date Taking? Authorizing Provider  atorvastatin (LIPITOR) 20 MG tablet TAKE 0.5 TABLETS (10 MG TOTAL) BY MOUTH DAILY. 12/28/17 12/28/18 Yes Pleas Koch, NP  Cholecalciferol (VITAMIN D3) 1000 units CAPS Take by mouth.   Yes [provider]  CHONDROITIN SULFATE PO Take 1,200 mg  by mouth daily.   Yes [provider]  Glucosamine-Chondroit-Vit C-Mn (GLUCOSAMINE 1500 COMPLEX PO) Take by mouth.   Yes [provider]  amoxicillin-clavulanate (AUGMENTIN) 875-125 MG tablet Take 1 tablet by mouth every 12 (twelve) hours. 02/23/18   Enrique Sack, FNP    Family History Family History  Problem Relation Age of Onset  . Breast cancer Maternal Aunt        great aunt  . Arthritis Maternal Grandmother   . Heart disease Maternal Grandfather   . Arthritis Paternal Grandmother   . Colon cancer Paternal Grandfather   . Skin cancer Father     Social History Social History   Tobacco Use  . Smoking status: Never Smoker  . Smokeless tobacco: Never Used  Substance Use Topics  . Alcohol use: Yes    Comment: social  . Drug use: No     Allergies   Iron dextran and Prednisone   Review of Systems Review of Systems  Constitutional: Negative for fever.  HENT: Positive for congestion, postnasal drip, rhinorrhea, sinus pressure, sinus pain and sore throat. Negative for ear pain, facial swelling, trouble swallowing and voice change.   Eyes: Positive for redness and itching. Negative for photophobia, pain, discharge and visual disturbance.  Respiratory: Positive for cough. Negative for chest tightness, shortness of breath and wheezing.   Cardiovascular: Negative for chest pain.  Gastrointestinal: Negative for nausea and vomiting.  Neurological: Positive  for headaches.     Physical Exam Triage Vital Signs ED Triage Vitals  Enc Vitals Group     BP 02/23/18 1818 (!) 142/83     Pulse Rate 02/23/18 1818 78     Resp 02/23/18 1818 16     Temp 02/23/18 1818 98 F (36.7 C)     Temp Source 02/23/18 1818 Oral     SpO2 02/23/18 1818 98 %     Weight 02/23/18 1820 155 lb (70.3 kg)     Height 02/23/18 1820 5\' 3"  (1.6 m)     Head Circumference --      Peak Flow --      Pain Score 02/23/18 1820 8     Pain Loc --      Pain Edu? --      Excl. in [place]? --     No data found.  Updated Vital Signs BP (!) 142/83 (BP Location: Right Arm)   Pulse 78   Temp 98 F (36.7 C) (Oral)   Resp 16   Ht 5\' 3"  (1.6 m)   Wt 155 lb (70.3 kg)   SpO2 98%   BMI 27.46 kg/m   Visual Acuity Right Eye Distance:   Left Eye Distance:   Bilateral Distance:    Right Eye Near:   Left Eye Near:    Bilateral Near:     Physical Exam  Constitutional: She is oriented to person, place, and time. She appears well-developed and well-nourished. No distress.  HENT:  Head: Normocephalic and atraumatic.  Right Ear: External ear normal.  Left Ear: External ear normal.  Nose: Left sinus exhibits maxillary sinus tenderness. Left sinus exhibits no frontal sinus tenderness.  Mouth/Throat: Uvula is midline, oropharynx is clear and moist and mucous membranes are normal. No oropharyngeal exudate.  Eyes: Pupils are equal, round, and reactive to light. EOM are normal. Left eye exhibits no chemosis, no discharge, no exudate and no hordeolum. No foreign body present in the left eye. Left conjunctiva is injected. No scleral icterus.  Neck: Normal range of motion. Neck supple.  Cardiovascular: Normal rate and regular rhythm.  Pulmonary/Chest: Effort normal and breath sounds normal.  Musculoskeletal: Normal range of motion.  Lymphadenopathy:    She has no cervical adenopathy.  Neurological: She is alert and oriented to person, place, and time.  Skin: Skin is warm and dry.  Psychiatric: She has a normal mood and affect. Her behavior is normal.     UC Treatments / Results  Labs (all labs ordered are listed, but only abnormal results are displayed) Labs Reviewed - No data to display  EKG None  Radiology No results found.  Procedures Procedures (including critical care time)  Medications Ordered in UC Medications - No data to display  Initial Impression / Assessment and Plan / UC Course  I have reviewed the triage vital signs and the nursing notes.  Pertinent labs &  imaging results that were available during my care of the patient were reviewed by me and considered in my medical decision making (see chart for details).     62 year old female presenting with congestion, facial pain, right frontal headache, productive cough with  yellow colored sputum and sinus pressure.  Physical exam reveals left maxillary tenderness.  Patient is nontoxic-appearing.  Vital signs stable.  Afebrile   Plan:  1. Sudafed 2. Augmentin 3. Nasal saline rinses as needed for congestion. 4. OTC allergy eye drops as needed  5. Follow-up with PCP in 3 days if symptoms  worsen or persist.   Evaluation has revealed no signs of a dangerous process. Discussed diagnosis with patient. Patient aware of their illness, possible red flag symptoms to watch out for and need for close follow up. Patient understands verbal and written discharge instructions. Patient comfortable with plan and disposition.  Patient has a clear mental status at this time, good insight into illness (after discussion and teaching) and has clear judgment to make decisions regarding their care.  Documentation was completed with the aid of voice recognition software. Transcription may contain typographical errors. Final Clinical Impressions(s) / UC Diagnoses   Final diagnoses:  Acute non-recurrent maxillary sinusitis     Discharge Instructions     Take antibiotics as prescribed.  Start taking an over-the-counter decongestant such as Sudafed, Allegra-D or Zyrtec-D.  Nasacort 2 sprays per nostril daily.  Tylenol or Motrin as needed.  Follow-up with primary care provider as needed.    ED Prescriptions    Medication Sig Dispense Auth. Provider   amoxicillin-clavulanate (AUGMENTIN) 875-125 MG tablet Take 1 tablet by mouth every 12 (twelve) hours. 14 tablet Enrique Sack, FNP     Controlled Substance Prescriptions [place] Controlled Substance Registry consulted? Not Applicable   Enrique Sack, [place] 02/23/18  1956

## 2018-02-23 NOTE — Discharge Instructions (Addendum)
Take antibiotics as prescribed.  Start taking an over-the-counter decongestant such as Sudafed, Allegra-D or Zyrtec-D.  Nasacort 2 sprays per nostril daily.  Tylenol or Motrin as needed.  Follow-up with primary care provider as needed.

## 2018-02-23 NOTE — ED Triage Notes (Signed)
Patient stated she woke up with pink eye.and sinus infection ,  cough, congested, sore throat headache and sinus pressure  since this morning.

## 2018-04-04 NOTE — Progress Notes (Signed)
Paula Gray  [phone] [phone]  ID: Paula Gray OB: May 28, 1956  MR#: [id number]  LKT#:[id number]  Patient Care Team: Pleas Koch, NP as PCP - General (Internal Medicine)  CHIEF COMPLAINT: Iron deficiency anemia.  INTERVAL HISTORY: Patient returns to clinic today for repeat laboratory work and further evaluation.  She continues to have chronic weakness and fatigue, but otherwise feels well.  She has no neurologic complaints.  She denies any recent fevers or illnesses.  She has a good appetite and denies weight loss.  She denies any chest pain or shortness of breath.  She has no nausea, vomiting, constipation, or diarrhea.  She denies any melena or hematochezia.  She has no urinary complaints.  Patient offers no further specific complaints today.  REVIEW OF SYSTEMS:   Review of Systems  Constitutional: Positive for malaise/fatigue. Negative for fever and weight loss.  Respiratory: Negative.  Negative for cough, hemoptysis and shortness of breath.   Cardiovascular: Negative.  Negative for chest pain and leg swelling.  Gastrointestinal: Negative.  Negative for abdominal pain, blood in stool and melena.  Genitourinary: Negative.  Negative for hematuria.  Musculoskeletal: Negative.  Negative for back pain.  Skin: Negative.  Negative for rash.  Neurological: Positive for weakness. Negative for sensory change and focal weakness.  Psychiatric/Behavioral: Negative.  The patient is not nervous/anxious.     As per HPI. Otherwise, a complete review of systems is negative.  PAST MEDICAL HISTORY: Past Medical History:  Diagnosis Date  . Arthritis   . Celiac disease   . Chickenpox   . Hyperlipidemia   . Migraine     PAST SURGICAL HISTORY: Past Surgical History:  Procedure Laterality Date  . CARPAL TUNNEL RELEASE  2006  . CESAREAN SECTION  1985  . RETINAL TEAR REPAIR CRYOTHERAPY Left 2015  . TONSILLECTOMY  1962    FAMILY  HISTORY: Family History  Problem Relation Age of Onset  . Breast cancer Maternal Aunt        great aunt  . Arthritis Maternal Grandmother   . Heart disease Maternal Grandfather   . Arthritis Paternal Grandmother   . Colon cancer Paternal Grandfather   . Skin cancer Father     ADVANCED DIRECTIVES (Y/N):  N  HEALTH MAINTENANCE: Social History   Tobacco Use  . Smoking status: Never Smoker  . Smokeless tobacco: Never Used  Substance Use Topics  . Alcohol use: Yes    Comment: social  . Drug use: No     Colonoscopy:  PAP:  Bone density:  Lipid panel:  Allergies  Allergen Reactions  . Iron Dextran   . Prednisone Other (See Comments)    History of celiac disease    Current Outpatient Medications  Medication Sig Dispense Refill  . atorvastatin (LIPITOR) 20 MG tablet TAKE 0.5 TABLETS (10 MG TOTAL) BY MOUTH DAILY. 45 tablet 1  . Cholecalciferol (VITAMIN D3) 1000 units CAPS Take by mouth.    . NON FORMULARY      No current facility-administered medications for this visit.     OBJECTIVE: Vitals:   04/06/18 1422  BP: (!) 148/88  Pulse: 84  Resp: 16  Temp: (!) 96.2 F (35.7 C)     Body mass index is 27.79 kg/m.    ECOG FS:0 - Asymptomatic  General: Well-developed, well-nourished, no acute distress. Eyes: Pink conjunctiva, anicteric sclera. Lungs: Clear to auscultation bilaterally. Heart: Regular rate and rhythm. No rubs, murmurs, or gallops. Abdomen: Soft, nontender, nondistended. No organomegaly noted,  normoactive bowel sounds. Musculoskeletal: No edema, cyanosis, or clubbing. Neuro: Alert, answering all questions appropriately. Cranial nerves grossly intact. Skin: No rashes or petechiae noted. Psych: Normal affect.   LAB RESULTS:  Lab Results  Component Value Date   NA 141 06/19/2017   K 4.7 06/19/2017   CL 107 06/19/2017   CO2 29 06/19/2017   GLUCOSE 121 (H) 06/19/2017   BUN 16 06/19/2017   CREATININE 0.85 06/19/2017   CALCIUM 9.6 06/19/2017   PROT  6.8 06/19/2017   ALBUMIN 4.3 06/19/2017   AST 18 06/19/2017   ALT 21 06/19/2017   ALKPHOS 54 06/19/2017   BILITOT 0.5 06/19/2017    Lab Results  Component Value Date   WBC 6.0 04/06/2018   NEUTROABS 3.0 04/06/2018   HGB 14.3 04/06/2018   HCT 41.5 04/06/2018   MCV 89.8 04/06/2018   PLT 215 04/06/2018   Lab Results  Component Value Date   IRON 67 04/06/2018   TIBC 357 04/06/2018   IRONPCTSAT 19 04/06/2018   Lab Results  Component Value Date   FERRITIN 112 04/06/2018     STUDIES: No results found.  ASSESSMENT: Iron deficiency anemia.  PLAN:   1. Iron deficiency anemia: Patient's hemoglobin and iron stores continue to be within normal limits.   Patient cannot remember the last time she received an iron infusion, but thinks it has been several years.  She reports that previously she required maintenance iron infusions.  She continues to insist that a ferritin below of 100 requires treatment.  Her ferritin is 112 today.  No intervention is needed at this time.  Return to clinic in 6 months for further evaluation  I spent a total of 20 minutes face-to-face with the patient of which greater than 50% of the visit was spent in counseling and coordination of care as detailed above.   Patient expressed understanding and was in agreement with this plan. She also understands that She can call clinic at any time with any questions, concerns, or complaints.    Lloyd Huger, MD   04/08/2018 9:29 AM

## 2018-04-05 ENCOUNTER — Other Ambulatory Visit: Payer: Self-pay | Admitting: *Deleted

## 2018-04-05 DIAGNOSIS — D649 Anemia, unspecified: Secondary | ICD-10-CM

## 2018-04-06 ENCOUNTER — Inpatient Hospital Stay: Payer: BLUE CROSS/BLUE SHIELD | Attending: Oncology

## 2018-04-06 ENCOUNTER — Inpatient Hospital Stay (HOSPITAL_BASED_OUTPATIENT_CLINIC_OR_DEPARTMENT_OTHER): Payer: BLUE CROSS/BLUE SHIELD | Admitting: Oncology

## 2018-04-06 ENCOUNTER — Inpatient Hospital Stay: Payer: BLUE CROSS/BLUE SHIELD

## 2018-04-06 ENCOUNTER — Other Ambulatory Visit: Payer: Self-pay

## 2018-04-06 VITALS — BP 148/88 | HR 84 | Temp 96.2°F | Resp 16 | Wt 156.9 lb

## 2018-04-06 DIAGNOSIS — D509 Iron deficiency anemia, unspecified: Secondary | ICD-10-CM | POA: Diagnosis not present

## 2018-04-06 DIAGNOSIS — M129 Arthropathy, unspecified: Secondary | ICD-10-CM

## 2018-04-06 DIAGNOSIS — R5383 Other fatigue: Secondary | ICD-10-CM | POA: Insufficient documentation

## 2018-04-06 DIAGNOSIS — K900 Celiac disease: Secondary | ICD-10-CM | POA: Insufficient documentation

## 2018-04-06 DIAGNOSIS — Z800 Family history of malignant neoplasm of digestive organs: Secondary | ICD-10-CM

## 2018-04-06 DIAGNOSIS — E785 Hyperlipidemia, unspecified: Secondary | ICD-10-CM

## 2018-04-06 DIAGNOSIS — Z79899 Other long term (current) drug therapy: Secondary | ICD-10-CM

## 2018-04-06 DIAGNOSIS — R531 Weakness: Secondary | ICD-10-CM | POA: Diagnosis not present

## 2018-04-06 DIAGNOSIS — D649 Anemia, unspecified: Secondary | ICD-10-CM

## 2018-04-06 DIAGNOSIS — Z803 Family history of malignant neoplasm of breast: Secondary | ICD-10-CM

## 2018-04-06 LAB — CBC WITH DIFFERENTIAL/PLATELET
Basophils Absolute: 0.1 10*3/uL (ref 0–0.1)
Basophils Relative: 1 %
EOS ABS: 0.2 10*3/uL (ref 0–0.7)
EOS PCT: 3 %
HCT: 41.5 % (ref 35.0–47.0)
HEMOGLOBIN: 14.3 g/dL (ref 12.0–16.0)
LYMPHS ABS: 2.3 10*3/uL (ref 1.0–3.6)
Lymphocytes Relative: 38 %
MCH: 31 pg (ref 26.0–34.0)
MCHC: 34.6 g/dL (ref 32.0–36.0)
MCV: 89.8 fL (ref 80.0–100.0)
MONOS PCT: 8 %
Monocytes Absolute: 0.5 10*3/uL (ref 0.2–0.9)
Neutro Abs: 3 10*3/uL (ref 1.4–6.5)
Neutrophils Relative %: 50 %
PLATELETS: 215 10*3/uL (ref 150–440)
RBC: 4.62 MIL/uL (ref 3.80–5.20)
RDW: 13.1 % (ref 11.5–14.5)
WBC: 6 10*3/uL (ref 3.6–11.0)

## 2018-04-06 LAB — IRON AND TIBC
Iron: 67 ug/dL (ref 28–170)
SATURATION RATIOS: 19 % (ref 10.4–31.8)
TIBC: 357 ug/dL (ref 250–450)
UIBC: 290 ug/dL

## 2018-04-06 LAB — FERRITIN: Ferritin: 112 ng/mL (ref 11–307)

## 2018-04-06 NOTE — Progress Notes (Signed)
Here for follow up. Stated "tired all the time"  " Im always tired "

## 2018-07-02 ENCOUNTER — Other Ambulatory Visit: Payer: Self-pay | Admitting: Primary Care

## 2018-08-09 ENCOUNTER — Other Ambulatory Visit: Payer: Self-pay | Admitting: Obstetrics & Gynecology

## 2018-08-09 DIAGNOSIS — Z1231 Encounter for screening mammogram for malignant neoplasm of breast: Secondary | ICD-10-CM

## 2018-09-05 DIAGNOSIS — Z23 Encounter for immunization: Secondary | ICD-10-CM | POA: Diagnosis not present

## 2018-09-14 ENCOUNTER — Ambulatory Visit
Admission: RE | Admit: 2018-09-14 | Discharge: 2018-09-14 | Disposition: A | Discharge status: Home / Self Care | Payer: BLUE CROSS/BLUE SHIELD | Source: Ambulatory Visit | Attending: Obstetrics & Gynecology | Admitting: Obstetrics & Gynecology

## 2018-09-14 DIAGNOSIS — Z1231 Encounter for screening mammogram for malignant neoplasm of breast: Secondary | ICD-10-CM

## 2018-09-24 ENCOUNTER — Other Ambulatory Visit: Payer: Self-pay | Admitting: Obstetrics & Gynecology

## 2018-09-24 DIAGNOSIS — R928 Other abnormal and inconclusive findings on diagnostic imaging of breast: Secondary | ICD-10-CM

## 2018-09-24 DIAGNOSIS — N6310 Unspecified lump in the right breast, unspecified quadrant: Secondary | ICD-10-CM

## 2018-09-30 ENCOUNTER — Ambulatory Visit
Admission: RE | Admit: 2018-09-30 | Discharge: 2018-09-30 | Disposition: A | Discharge status: Home / Self Care | Payer: BLUE CROSS/BLUE SHIELD | Source: Ambulatory Visit | Attending: Obstetrics & Gynecology | Admitting: Obstetrics & Gynecology

## 2018-09-30 DIAGNOSIS — N6001 Solitary cyst of right breast: Secondary | ICD-10-CM | POA: Diagnosis not present

## 2018-09-30 DIAGNOSIS — N6310 Unspecified lump in the right breast, unspecified quadrant: Secondary | ICD-10-CM | POA: Diagnosis not present

## 2018-09-30 DIAGNOSIS — R928 Other abnormal and inconclusive findings on diagnostic imaging of breast: Secondary | ICD-10-CM | POA: Insufficient documentation

## 2018-10-07 ENCOUNTER — Other Ambulatory Visit: Payer: Self-pay | Admitting: Primary Care

## 2018-10-07 ENCOUNTER — Ambulatory Visit: Payer: BLUE CROSS/BLUE SHIELD

## 2018-10-12 ENCOUNTER — Ambulatory Visit: Payer: BLUE CROSS/BLUE SHIELD | Admitting: Oncology

## 2018-10-12 ENCOUNTER — Ambulatory Visit: Payer: BLUE CROSS/BLUE SHIELD

## 2018-10-13 ENCOUNTER — Ambulatory Visit: Payer: BLUE CROSS/BLUE SHIELD

## 2018-10-13 ENCOUNTER — Ambulatory Visit: Payer: BLUE CROSS/BLUE SHIELD | Admitting: Oncology

## 2018-10-14 ENCOUNTER — Other Ambulatory Visit: Payer: Self-pay | Admitting: *Deleted

## 2018-10-14 ENCOUNTER — Encounter: Payer: Self-pay | Admitting: *Deleted

## 2018-10-14 DIAGNOSIS — E785 Hyperlipidemia, unspecified: Secondary | ICD-10-CM

## 2018-10-14 NOTE — Telephone Encounter (Signed)
Last office visit 02/16/2018 for knee pain.  Last refilled 07/02/2018 for #45 with note stating needs office visit prior to any additional refills.  Patient is on Kate's schedule 10/15/2018 to speak with Anda Kraft about skin cancer.  Will forward to [place] to discuss/fill at that office visit.

## 2018-10-15 ENCOUNTER — Ambulatory Visit: Payer: BLUE CROSS/BLUE SHIELD | Admitting: Primary Care

## 2018-10-15 ENCOUNTER — Encounter: Payer: Self-pay | Admitting: Primary Care

## 2018-10-15 VITALS — BP 120/78 | HR 92 | Temp 97.8°F | Ht 63.0 in | Wt 158.5 lb

## 2018-10-15 DIAGNOSIS — E785 Hyperlipidemia, unspecified: Secondary | ICD-10-CM

## 2018-10-15 DIAGNOSIS — Z1159 Encounter for screening for other viral diseases: Secondary | ICD-10-CM | POA: Diagnosis not present

## 2018-10-15 DIAGNOSIS — Z0000 Encounter for general adult medical examination without abnormal findings: Secondary | ICD-10-CM | POA: Diagnosis not present

## 2018-10-15 DIAGNOSIS — D509 Iron deficiency anemia, unspecified: Secondary | ICD-10-CM

## 2018-10-15 DIAGNOSIS — K900 Celiac disease: Secondary | ICD-10-CM

## 2018-10-15 DIAGNOSIS — R7303 Prediabetes: Secondary | ICD-10-CM

## 2018-10-15 MED ORDER — ATORVASTATIN CALCIUM 20 MG PO TABS: 10.0000 mg | ORAL_TABLET | Freq: Every day | ORAL | 0 refills | Status: DC

## 2018-10-15 NOTE — Assessment & Plan Note (Signed)
Overall doing well.  No new symptoms.

## 2018-10-15 NOTE — Progress Notes (Signed)
Subjective:    Patient ID: Paula Gray, female    DOB: [birth year], 63 y.o.   MRN: [id number]  HPI  Paula Gray is a 63 year old female who presents today for complete physical.  Immunizations: -Tetanus: Completed in 2017 -Influenza: Completed this season    Diet: She endorses a healthy diet Breakfast: Oatmeal, eggs, paleo pancakes Lunch: Protein, starch, frozen dinner  Dinner: Protein, vegetable Snacks: Occasionally gluten free pretzels, fruit Desserts: Once weekly  Beverages: Coffee, water, occasional [place]  Exercise: She is not exercising  Eye exam: Completes regularly  Dental exam: Completes semi-annually  Colonoscopy: Completed in 2016 Pap Smear: Follows with GYN Mammogram: Completed in December 2019 Hep C Screen: Due   Review of Systems  Constitutional: Negative for unexpected weight change.  HENT: Negative for rhinorrhea.   Respiratory: Negative for cough and shortness of breath.   Cardiovascular: Negative for chest pain.  Gastrointestinal: Negative for constipation and diarrhea.  Genitourinary: Negative for difficulty urinating.  Musculoskeletal: Positive for arthralgias. Negative for myalgias.  Skin: Negative for rash.  Allergic/Immunologic: Negative for environmental allergies.  Neurological: Negative for dizziness, numbness and headaches.  Psychiatric/Behavioral: The patient is not nervous/anxious.        Past Medical History:  Diagnosis Date  . Arthritis   . Celiac disease   . Chickenpox   . Hyperlipidemia   . Migraine      Social History   Socioeconomic History  . Marital status: Divorced    Spouse name: Not on file  . Number of children: Not on file  . Years of education: Not on file  . Highest education level: Not on file  Occupational History  . Not on file  Social Needs  . Financial resource strain: Not on file  . Food insecurity:    Worry: Not on file    Inability: Not on file  . Transportation needs:    Medical: Not on  file    Non-medical: Not on file  Tobacco Use  . Smoking status: Never Smoker  . Smokeless tobacco: Never Used  Substance and Sexual Activity  . Alcohol use: Yes    Comment: social  . Drug use: No  . Sexual activity: Not on file  Lifestyle  . Physical activity:    Days per week: Not on file    Minutes per session: Not on file  . Stress: Not on file  Relationships  . Social connections:    Talks on phone: Not on file    Gets together: Not on file    Attends religious service: Not on file    Active member of club or organization: Not on file    Attends meetings of clubs or organizations: Not on file    Relationship status: Not on file  . Intimate partner violence:    Fear of current or ex partner: Not on file    Emotionally abused: Not on file    Physically abused: Not on file    Forced sexual activity: Not on file  Other Topics Concern  . Not on file  Social History Narrative   Single.    2 children. 1 grandchild.   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     Past Surgical History:  Procedure Laterality Date  . CARPAL TUNNEL RELEASE  2006  . CESAREAN SECTION  1985  . RETINAL TEAR REPAIR CRYOTHERAPY Left 2015  . TONSILLECTOMY  1962    Family History  Problem  Relation Age of Onset  . Breast cancer Maternal Aunt        great aunt  . Arthritis Maternal Grandmother   . Heart disease Maternal Grandfather   . Arthritis Paternal Grandmother   . Colon cancer Paternal Grandfather   . Skin cancer Father     Allergies  Allergen Reactions  . Iron Dextran   . Prednisone Other (See Comments)    History of celiac disease    Current Outpatient Medications on File Prior to Visit  Medication Sig Dispense Refill  . atorvastatin (LIPITOR) 20 MG tablet Take 0.5 tablets (10 mg total) by mouth daily. NEED APPOINTMENT FOR ANY MORE REFILLS 45 tablet 0  . Cholecalciferol (VITAMIN D3) 1000 units CAPS Take by mouth.    . NON FORMULARY      No  current facility-administered medications on file prior to visit.     BP 120/78   Pulse 92   Temp 97.8 F (36.6 C) (Oral)   Ht 5\' 3"  (1.6 m)   Wt 158 lb 8 oz (71.9 kg)   SpO2 99%   BMI 28.08 kg/m    Objective:   Physical Exam  Constitutional: She is oriented to person, place, and time. She appears well-nourished.  HENT:  Mouth/Throat: No oropharyngeal exudate.  Eyes: Pupils are equal, round, and reactive to light. EOM are normal.  Neck: Neck supple. No thyromegaly present.  Cardiovascular: Normal rate and regular rhythm.  Respiratory: Effort normal and breath sounds normal.  GI: Soft. Bowel sounds are normal. There is no abdominal tenderness.  Musculoskeletal: Normal range of motion.  Neurological: She is alert and oriented to person, place, and time.  Skin: Skin is warm and dry.  Psychiatric: She has a normal mood and affect.           Assessment & Plan:

## 2018-10-15 NOTE — Assessment & Plan Note (Signed)
Compliant to atorvastatin, lipid panel pending. Continue same.

## 2018-10-15 NOTE — Telephone Encounter (Signed)
Completed CPE today, labs pending. Refill sent to pharmacy.

## 2018-10-15 NOTE — Assessment & Plan Note (Signed)
Immunizations UTD. Pap smear due, following with GYN. Mammogram UTD. Colonoscopy UTD. Recommended to work on diet, start regular exercise. Exam unremarkable. Labs pending. Follow up in 1 year for CPE.

## 2018-10-15 NOTE — Assessment & Plan Note (Signed)
Following with hematology, missed last appointment this month. Will draw labs and have patient send to hematologist.

## 2018-10-15 NOTE — Patient Instructions (Signed)
Start exercising. You should be getting 150 minutes of moderate intensity exercise weekly.  It's important to improve your diet by reducing consumption of fast food, fried food, processed snack foods, sugary drinks. Increase consumption of fresh vegetables and fruits, whole grains, water.  Ensure you are drinking 64 ounces of water daily.  Follow up with your GYN as scheduled.   Schedule a lab only appointment to return for fasting labs. Just fast four hours. You may have water.   We will see you next year or sooner if needed! It was a pleasure to see you today!   Preventive Care 40-64 Years, Female Preventive care refers to lifestyle choices and visits with your health care provider that can promote health and wellness. What does preventive care include?   A yearly physical exam. This is also called an annual well check.  Dental exams once or twice a year.  Routine eye exams. Ask your health care provider how often you should have your eyes checked.  Personal lifestyle choices, including: ? Daily care of your teeth and gums. ? Regular physical activity. ? Eating a healthy diet. ? Avoiding tobacco and drug use. ? Limiting alcohol use. ? Practicing safe sex. ? Taking low-dose aspirin daily starting at age 67. ? Taking vitamin and mineral supplements as recommended by your health care provider. What happens during an annual well check? The services and screenings done by your health care provider during your annual well check will depend on your age, overall health, lifestyle risk factors, and family history of disease. Counseling Your health care provider may ask you questions about your:  Alcohol use.  Tobacco use.  Drug use.  Emotional well-being.  Home and relationship well-being.  Sexual activity.  Eating habits.  Work and work Statistician.  Method of birth control.  Menstrual cycle.  Pregnancy history. Screening You may have the following tests or  measurements:  Height, weight, and BMI.  Blood pressure.  Lipid and cholesterol levels. These may be checked every 5 years, or more frequently if you are over 69 years old.  Skin check.  Lung cancer screening. You may have this screening every year starting at age 71 if you have a 30-pack-year history of smoking and currently smoke or have quit within the past 15 years.  Colorectal cancer screening. All adults should have this screening starting at age 48 and continuing until age 58. Your health care provider may recommend screening at age 19. You will have tests every 1-10 years, depending on your results and the type of screening test. People at increased risk should start screening at an earlier age. Screening tests may include: ? Guaiac-based fecal occult blood testing. ? Fecal immunochemical test (FIT). ? Stool DNA test. ? Virtual colonoscopy. ? Sigmoidoscopy. During this test, a flexible tube with a tiny camera (sigmoidoscope) is used to examine your rectum and lower colon. The sigmoidoscope is inserted through your anus into your rectum and lower colon. ? Colonoscopy. During this test, a long, thin, flexible tube with a tiny camera (colonoscope) is used to examine your entire colon and rectum.  Hepatitis C blood test.  Hepatitis B blood test.  Sexually transmitted disease (STD) testing.  Diabetes screening. This is done by checking your blood sugar (glucose) after you have not eaten for a while (fasting). You may have this done every 1-3 years.  Mammogram. This may be done every 1-2 years. Talk to your health care provider about when you should start having regular mammograms. This  may depend on whether you have a family history of breast cancer.  BRCA-related cancer screening. This may be done if you have a family history of breast, ovarian, tubal, or peritoneal cancers.  Pelvic exam and Pap test. This may be done every 3 years starting at age 61. Starting at age 27, this may  be done every 5 years if you have a Pap test in combination with an HPV test.  Bone density scan. This is done to screen for osteoporosis. You may have this scan if you are at high risk for osteoporosis. Discuss your test results, treatment options, and if necessary, the need for more tests with your health care provider. Vaccines Your health care provider may recommend certain vaccines, such as:  Influenza vaccine. This is recommended every year.  Tetanus, diphtheria, and acellular pertussis (Tdap, Td) vaccine. You may need a Td booster every 10 years.  Varicella vaccine. You may need this if you have not been vaccinated.  Zoster vaccine. You may need this after age 69.  Measles, mumps, and rubella (MMR) vaccine. You may need at least one dose of MMR if you were born in 1957 or later. You may also need a second dose.  Pneumococcal 13-valent conjugate (PCV13) vaccine. You may need this if you have certain conditions and were not previously vaccinated.  Pneumococcal polysaccharide (PPSV23) vaccine. You may need one or two doses if you smoke cigarettes or if you have certain conditions.  Meningococcal vaccine. You may need this if you have certain conditions.  Hepatitis A vaccine. You may need this if you have certain conditions or if you travel or work in places where you may be exposed to hepatitis A.  Hepatitis B vaccine. You may need this if you have certain conditions or if you travel or work in places where you may be exposed to hepatitis B.  Haemophilus influenzae type b (Hib) vaccine. You may need this if you have certain conditions. Talk to your health care provider about which screenings and vaccines you need and how often you need them. This information is not intended to replace advice given to you by your health care provider. Make sure you discuss any questions you have with your health care provider. Document Released: 10/12/2015 Document Revised: 11/05/2017 Document  Reviewed: 07/17/2015 Elsevier Interactive Patient Education  2019 [employer].

## 2018-10-16 LAB — HEMOGLOBIN A1C
Hgb A1c MFr Bld: 6.1 % of total Hgb — ABNORMAL HIGH (ref <5.7)
MEAN PLASMA GLUCOSE: 128 (calc)
eAG (mmol/L): 7.1 (calc)

## 2018-10-16 LAB — CBC
HCT: 42.5 % (ref 35.0–45.0)
Hemoglobin: 14.7 g/dL (ref 11.7–15.5)
MCH: 30.3 pg (ref 27.0–33.0)
MCHC: 34.6 g/dL (ref 32.0–36.0)
MCV: 87.6 fL (ref 80.0–100.0)
MPV: 11.3 fL (ref 7.5–12.5)
Platelets: 253 10*3/uL (ref 140–400)
RBC: 4.85 10*6/uL (ref 3.80–5.10)
RDW: 12.5 % (ref 11.0–15.0)
WBC: 6.9 10*3/uL (ref 3.8–10.8)

## 2018-10-16 LAB — HEPATITIS C ANTIBODY
Hepatitis C Ab: NONREACTIVE
SIGNAL TO CUT-OFF: 0.07 (ref <1.00)

## 2018-10-16 LAB — COMPREHENSIVE METABOLIC PANEL
AG RATIO: 1.6 (calc) (ref 1.0–2.5)
ALBUMIN MSPROF: 4.5 g/dL (ref 3.6–5.1)
ALT: 18 U/L (ref 6–29)
AST: 17 U/L (ref 10–35)
Alkaline phosphatase (APISO): 71 U/L (ref 33–130)
BUN: 14 mg/dL (ref 7–25)
CO2: 25 mmol/L (ref 20–32)
Calcium: 9.9 mg/dL (ref 8.6–10.4)
Chloride: 103 mmol/L (ref 98–110)
Creat: 0.84 mg/dL (ref 0.50–0.99)
Globulin: 2.8 g/dL (calc) (ref 1.9–3.7)
Glucose, Bld: 115 mg/dL — ABNORMAL HIGH (ref 65–99)
Potassium: 4.1 mmol/L (ref 3.5–5.3)
Sodium: 140 mmol/L (ref 135–146)
Total Bilirubin: 0.7 mg/dL (ref 0.2–1.2)
Total Protein: 7.3 g/dL (ref 6.1–8.1)

## 2018-10-16 LAB — IRON, TOTAL/TOTAL IRON BINDING CAP
%SAT: 15 % (calc) — ABNORMAL LOW (ref 16–45)
Iron: 54 ug/dL (ref 45–160)
TIBC: 350 mcg/dL (calc) (ref 250–450)

## 2018-10-16 LAB — FERRITIN: Ferritin: 135 ng/mL (ref 16–288)

## 2018-10-20 DIAGNOSIS — Z01419 Encounter for gynecological examination (general) (routine) without abnormal findings: Secondary | ICD-10-CM | POA: Diagnosis not present

## 2018-10-20 DIAGNOSIS — Z1151 Encounter for screening for human papillomavirus (HPV): Secondary | ICD-10-CM | POA: Diagnosis not present

## 2018-10-20 DIAGNOSIS — Z6827 Body mass index (BMI) 27.0-27.9, adult: Secondary | ICD-10-CM | POA: Diagnosis not present

## 2018-10-20 DIAGNOSIS — Z113 Encounter for screening for infections with a predominantly sexual mode of transmission: Secondary | ICD-10-CM | POA: Diagnosis not present

## 2018-10-21 ENCOUNTER — Other Ambulatory Visit: Payer: Self-pay | Admitting: Obstetrics & Gynecology

## 2018-10-21 DIAGNOSIS — E2839 Other primary ovarian failure: Secondary | ICD-10-CM

## 2018-11-01 ENCOUNTER — Other Ambulatory Visit: Payer: BLUE CROSS/BLUE SHIELD

## 2018-12-28 IMAGING — DX DG KNEE COMPLETE 4+V*R*
4 series · 4 of 4 positions shown · non-contrast
Comparison: None

CLINICAL DATA: Acute on chronic RIGHT knee pain, increased knee
pain and swelling, no injury

EXAM:
RIGHT KNEE - COMPLETE 4+ VIEW

[knee ap]
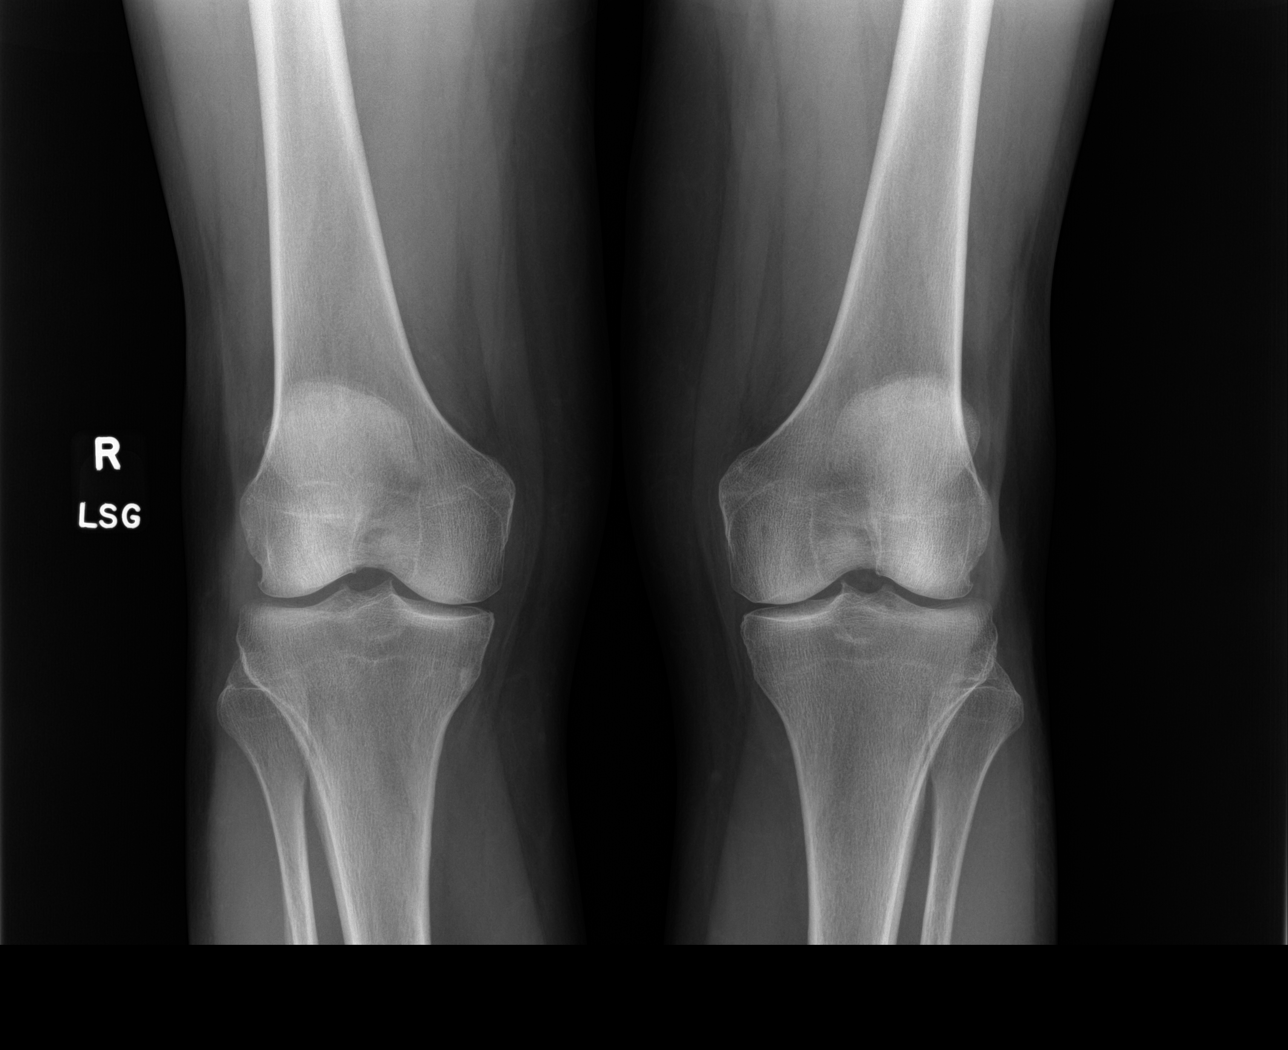

[knee tunnel]
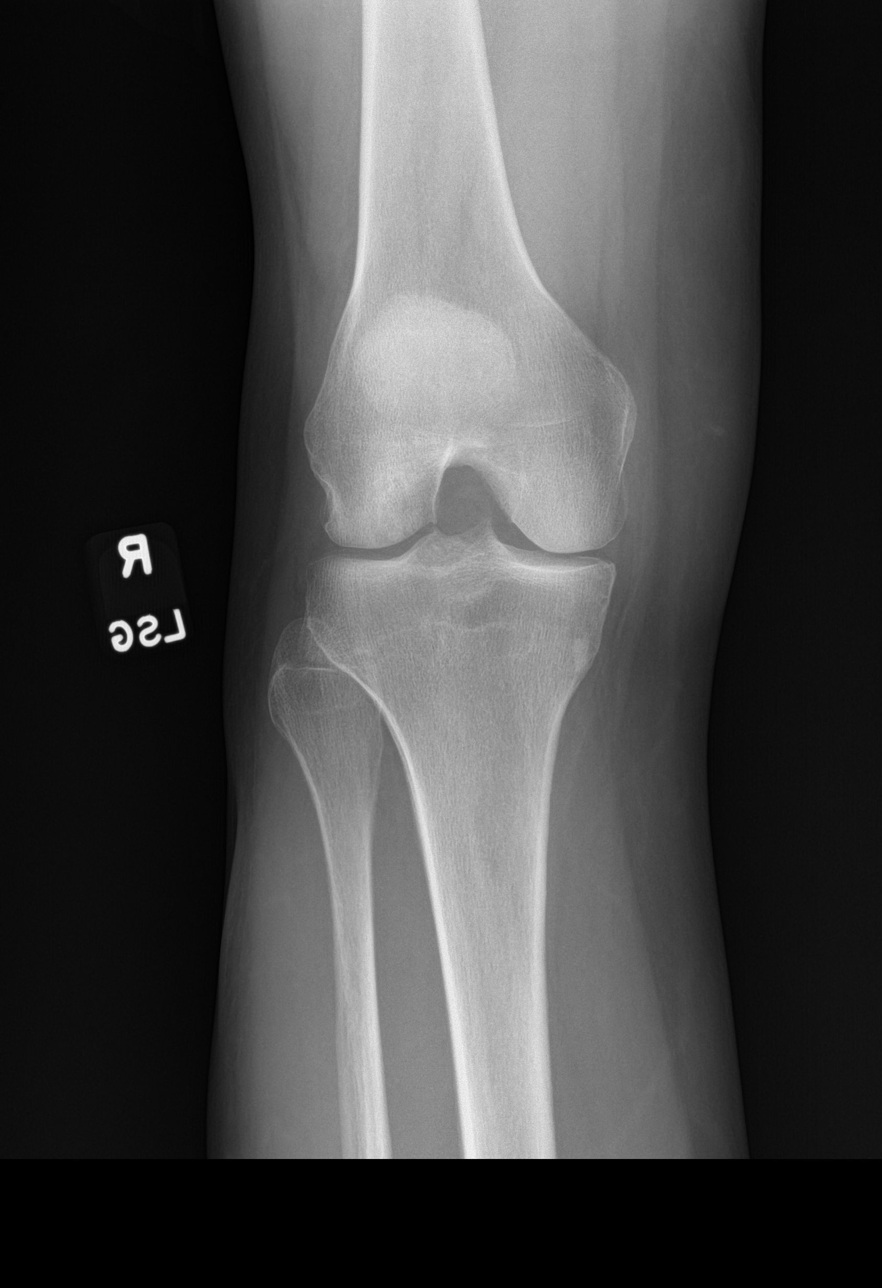

[knee lat]
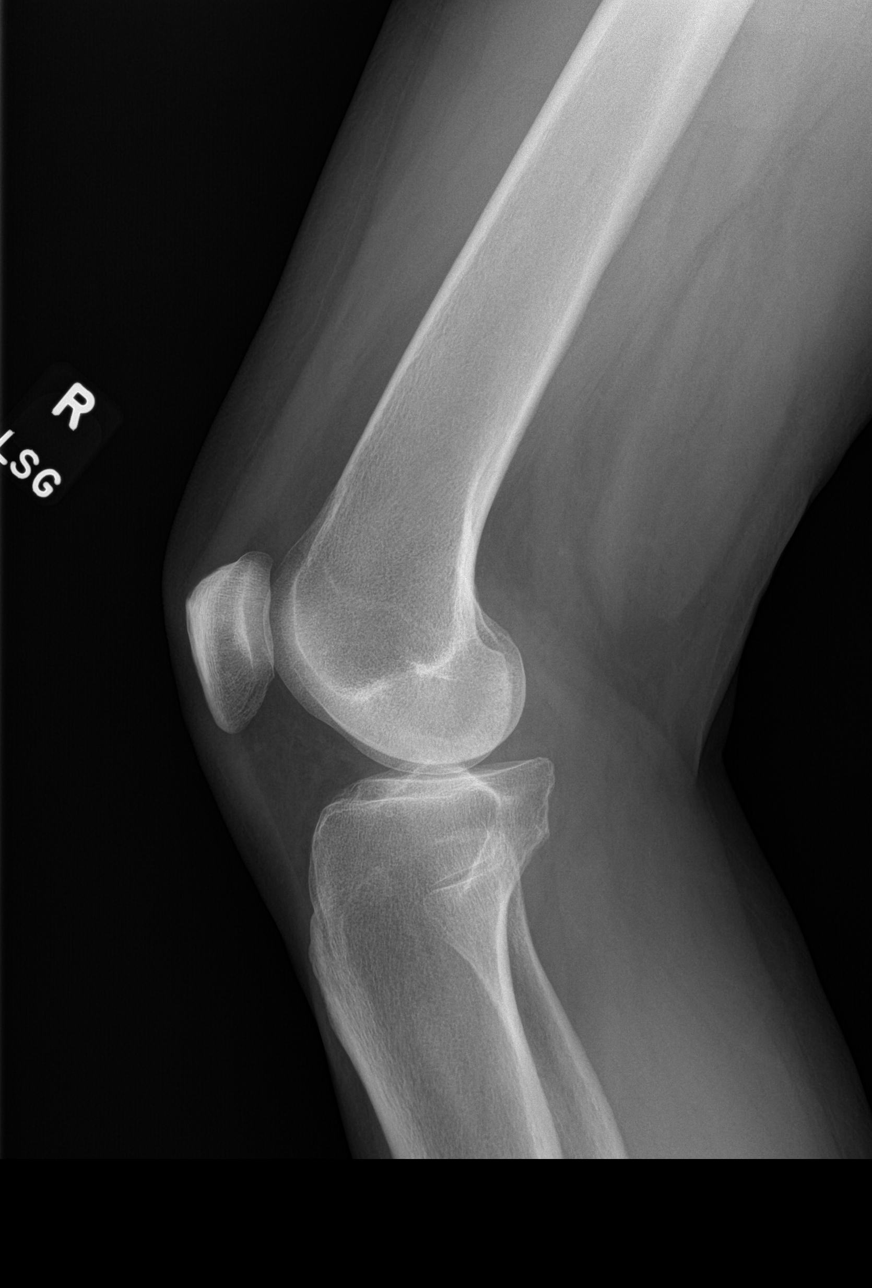

[patella skyline]
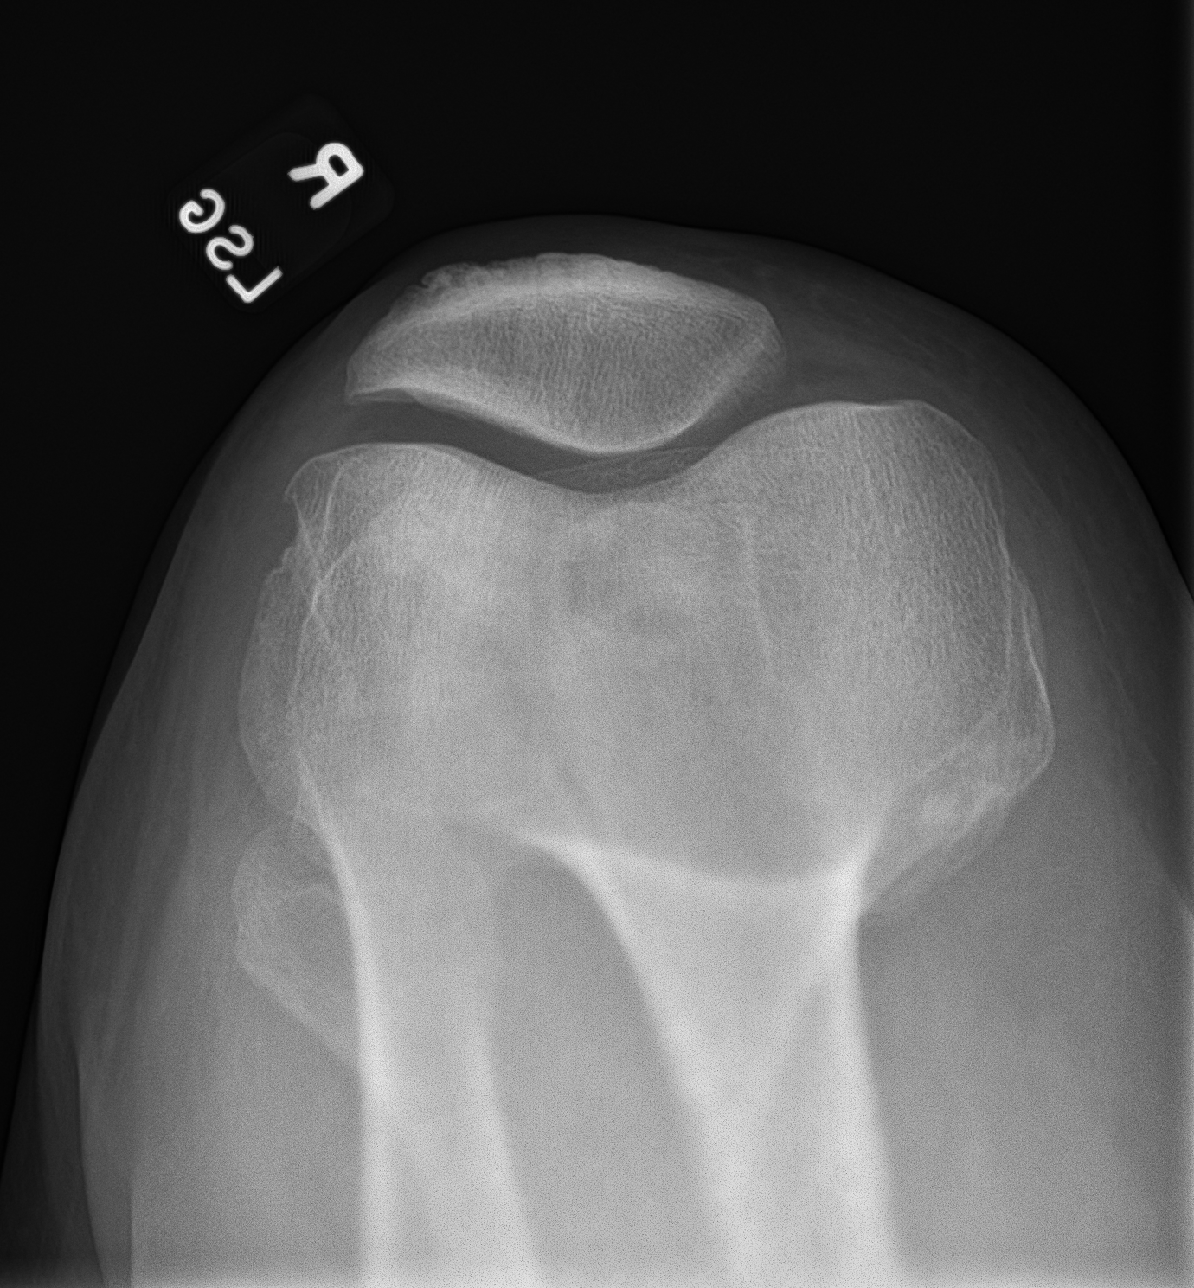

[4 of 4 positions shown; findings below may reference images not displayed]

FINDINGS: Osseous demineralization.

Diffuse joint space narrowing.

No acute fracture, dislocation, or bone destruction.

No knee joint effusion.

Probable bone island at medial tibial plateau.

Included AP view of the LEFT knee demonstrates joint space narrowing
at the medial and lateral compartments.
IMPRESSION: Degenerative changes of both knees as above.

## 2019-01-10 ENCOUNTER — Other Ambulatory Visit: Payer: Self-pay | Admitting: Primary Care

## 2019-01-10 DIAGNOSIS — E785 Hyperlipidemia, unspecified: Secondary | ICD-10-CM

## 2019-02-09 ENCOUNTER — Telehealth: Payer: Self-pay | Admitting: Primary Care

## 2019-02-09 DIAGNOSIS — D509 Iron deficiency anemia, unspecified: Secondary | ICD-10-CM

## 2019-02-09 NOTE — Telephone Encounter (Signed)
Best number [phone] Pt called wanting to get a referral for hematology she stated she wants to go back to Dr Verner Chol He is in [facility]  Fax number [phone] Phone # [phone]

## 2019-02-10 NOTE — Telephone Encounter (Signed)
Message left for patient to return my call.  

## 2019-02-10 NOTE — Telephone Encounter (Signed)
Please notify patient that I placed the referral, someone should be in touch within one week. Have her call us if that is not the case.

## 2019-02-11 NOTE — Telephone Encounter (Signed)
Per DPR, left detail message of Kate Clark's comments for patient. 

## 2019-04-27 DIAGNOSIS — E611 Iron deficiency: Secondary | ICD-10-CM | POA: Diagnosis not present

## 2019-05-06 DIAGNOSIS — E611 Iron deficiency: Secondary | ICD-10-CM | POA: Diagnosis not present

## 2019-05-13 DIAGNOSIS — X58XXXA Exposure to other specified factors, initial encounter: Secondary | ICD-10-CM | POA: Diagnosis not present

## 2019-05-13 DIAGNOSIS — R51 Headache: Secondary | ICD-10-CM | POA: Diagnosis not present

## 2019-05-13 DIAGNOSIS — E611 Iron deficiency: Secondary | ICD-10-CM | POA: Diagnosis not present

## 2019-05-13 DIAGNOSIS — L299 Pruritus, unspecified: Secondary | ICD-10-CM | POA: Diagnosis not present

## 2019-05-13 DIAGNOSIS — R6889 Other general symptoms and signs: Secondary | ICD-10-CM | POA: Diagnosis not present

## 2019-05-13 DIAGNOSIS — R110 Nausea: Secondary | ICD-10-CM | POA: Diagnosis not present

## 2019-05-13 DIAGNOSIS — T458X5A Adverse effect of other primarily systemic and hematological agents, initial encounter: Secondary | ICD-10-CM | POA: Diagnosis not present

## 2019-05-20 DIAGNOSIS — E611 Iron deficiency: Secondary | ICD-10-CM | POA: Diagnosis not present

## 2019-05-27 DIAGNOSIS — E611 Iron deficiency: Secondary | ICD-10-CM | POA: Diagnosis not present

## 2019-06-14 ENCOUNTER — Telehealth: Payer: Self-pay | Admitting: Primary Care

## 2019-06-14 DIAGNOSIS — Z1211 Encounter for screening for malignant neoplasm of colon: Secondary | ICD-10-CM

## 2019-06-14 NOTE — Telephone Encounter (Signed)
Please notify patient that I am happy to help but needs some additional information. When was her last colonoscopy? What did it find? When was she told to return?

## 2019-06-14 NOTE — Telephone Encounter (Signed)
Pt called back; pts last colonoscopy was 02/27/2014. The findings were mild diverticulosis and small internal hemorrhoid but no polyps found. Pt said previous doctor office in [facility] called pt 2 wks ago and said it was time for colonoscopy.

## 2019-06-14 NOTE — Telephone Encounter (Addendum)
Spoken and notified patient of Paula Gray comments. Patient received a letter from Dr. Wynona Neat. Shearin who is her previous GI in [place] to reminder to get this schedule. Patient does not want to go back there since it far for her.  Last coloscopy was about 5-6 years ago  She stated no abnormal findings she thinks. She have family history of colon cancer.  She misplaced the letter but sometimes soon she thinks.

## 2019-06-14 NOTE — Telephone Encounter (Signed)
Patient stated she is need a colonoscopy done.She is requested a referral sent to Dr Lelon Frohlich in [place].  Are you able to send the referral or have a visit with the patient first?

## 2019-06-14 NOTE — Telephone Encounter (Signed)
Noted, referral placed.  

## 2019-06-15 ENCOUNTER — Telehealth: Payer: Self-pay | Admitting: Gastroenterology

## 2019-06-15 ENCOUNTER — Other Ambulatory Visit: Payer: Self-pay

## 2019-06-15 DIAGNOSIS — Z1211 Encounter for screening for malignant neoplasm of colon: Secondary | ICD-10-CM

## 2019-06-15 MED ORDER — NA SULFATE-K SULFATE-MG SULF 17.5-3.13-1.6 GM/177ML PO SOLN: 1.0000 | Freq: Once | ORAL | 0 refills | Status: AC

## 2019-06-15 NOTE — Telephone Encounter (Signed)
Patient called & ask that Sharyn Lull call her today if possible after 3:00 because she will be in a meeting.

## 2019-06-15 NOTE — Telephone Encounter (Signed)
Gastroenterology Pre-Procedure Review  Request Date: 06/24/19 Requesting Physician: Dr. Vicente Males  PATIENT REVIEW QUESTIONS: The patient responded to the following health history questions as indicated:    1. Are you having any GI issues? no 2. Do you have a personal history of Polyps? no 3. Do you have a family history of Colon Cancer or Polyps? yes (Grandfather colon cancer) 4. Diabetes Mellitus? no 5. Joint replacements in the past 12 months?no 6. Major health problems in the past 3 months?no 7. Any artificial heart valves, MVP, or defibrillator?no    MEDICATIONS & ALLERGIES:    Patient reports the following regarding taking any anticoagulation/antiplatelet therapy:   Plavix, Coumadin, Eliquis, Xarelto, Lovenox, Pradaxa, Brilinta, or Effient? no Aspirin? no  Patient confirms/reports the following medications:  Current Outpatient Medications  Medication Sig Dispense Refill  . atorvastatin (LIPITOR) 20 MG tablet Take 0.5 tablets (10 mg total) by mouth daily. For cholesterol. 45 tablet 0  . Cholecalciferol (VITAMIN D3) 1000 units CAPS Take by mouth.    . NON FORMULARY      No current facility-administered medications for this visit.     Patient confirms/reports the following allergies:  Allergies  Allergen Reactions  . Iron Dextran   . Prednisone Other (See Comments)    History of celiac disease    No orders of the defined types were placed in this encounter.   AUTHORIZATION INFORMATION Primary Insurance: 1D#: Group #:  Secondary Insurance: 1D#: Group #:  SCHEDULE INFORMATION: Date: 06/24/19 Time: Location:[facility]

## 2019-06-21 ENCOUNTER — Other Ambulatory Visit: Payer: Self-pay

## 2019-06-21 ENCOUNTER — Other Ambulatory Visit
Admission: RE | Admit: 2019-06-21 | Discharge: 2019-06-21 | Disposition: A | Discharge status: Home / Self Care | Payer: BC Managed Care – PPO | Source: Ambulatory Visit | Attending: Gastroenterology | Admitting: Gastroenterology

## 2019-06-21 DIAGNOSIS — Z20828 Contact with and (suspected) exposure to other viral communicable diseases: Secondary | ICD-10-CM | POA: Diagnosis not present

## 2019-06-21 DIAGNOSIS — Z01812 Encounter for preprocedural laboratory examination: Secondary | ICD-10-CM | POA: Insufficient documentation

## 2019-06-22 LAB — SARS CORONAVIRUS 2 (TAT 6-24 HRS): SARS Coronavirus 2: NEGATIVE

## 2019-06-24 ENCOUNTER — Ambulatory Visit: Payer: BC Managed Care – PPO | Admitting: Anesthesiology

## 2019-06-24 ENCOUNTER — Ambulatory Visit
Admission: RE | Admit: 2019-06-24 | Discharge: 2019-06-24 | Disposition: A | Discharge status: Home / Self Care | Payer: BC Managed Care – PPO | Attending: Gastroenterology | Admitting: Gastroenterology

## 2019-06-24 ENCOUNTER — Other Ambulatory Visit: Payer: Self-pay

## 2019-06-24 ENCOUNTER — Encounter
Admission: RE | Disposition: A | Discharge status: Home / Self Care | Payer: Self-pay | Source: Home / Self Care | Attending: Gastroenterology

## 2019-06-24 DIAGNOSIS — Z79899 Other long term (current) drug therapy: Secondary | ICD-10-CM | POA: Insufficient documentation

## 2019-06-24 DIAGNOSIS — K5730 Diverticulosis of large intestine without perforation or abscess without bleeding: Secondary | ICD-10-CM | POA: Insufficient documentation

## 2019-06-24 DIAGNOSIS — M1990 Unspecified osteoarthritis, unspecified site: Secondary | ICD-10-CM | POA: Diagnosis not present

## 2019-06-24 DIAGNOSIS — Z8371 Family history of colonic polyps: Secondary | ICD-10-CM | POA: Insufficient documentation

## 2019-06-24 DIAGNOSIS — Z1211 Encounter for screening for malignant neoplasm of colon: Secondary | ICD-10-CM | POA: Diagnosis not present

## 2019-06-24 DIAGNOSIS — K635 Polyp of colon: Secondary | ICD-10-CM | POA: Diagnosis not present

## 2019-06-24 DIAGNOSIS — E785 Hyperlipidemia, unspecified: Secondary | ICD-10-CM | POA: Insufficient documentation

## 2019-06-24 DIAGNOSIS — D126 Benign neoplasm of colon, unspecified: Secondary | ICD-10-CM | POA: Diagnosis not present

## 2019-06-24 DIAGNOSIS — D120 Benign neoplasm of cecum: Secondary | ICD-10-CM | POA: Diagnosis not present

## 2019-06-24 HISTORY — PX: COLONOSCOPY WITH PROPOFOL: SHX5780

## 2019-06-24 SURGERY — COLONOSCOPY WITH PROPOFOL
Anesthesia: General

## 2019-06-24 MED ORDER — MIDAZOLAM HCL 2 MG/2ML IJ SOLN
INTRAMUSCULAR | Status: AC
  Filled 2019-06-24: qty 2

## 2019-06-24 MED ORDER — PROPOFOL 500 MG/50ML IV EMUL
INTRAVENOUS | Status: AC
  Filled 2019-06-24: qty 50

## 2019-06-24 MED ORDER — LIDOCAINE HCL (CARDIAC) PF 100 MG/5ML IV SOSY
PREFILLED_SYRINGE | INTRAVENOUS | Status: DC | PRN
  Administered 2019-06-24: 50 mg via INTRAVENOUS

## 2019-06-24 MED ORDER — SODIUM CHLORIDE 0.9 % IV SOLN
INTRAVENOUS | Status: DC
  Administered 2019-06-24: 1000 mL via INTRAVENOUS

## 2019-06-24 MED ORDER — PROPOFOL 500 MG/50ML IV EMUL
INTRAVENOUS | Status: DC | PRN
  Administered 2019-06-24: 175 ug/kg/min via INTRAVENOUS

## 2019-06-24 MED ORDER — MIDAZOLAM HCL 2 MG/2ML IJ SOLN
INTRAMUSCULAR | Status: DC | PRN
  Administered 2019-06-24: 2 mg via INTRAVENOUS

## 2019-06-24 MED ORDER — PROPOFOL 10 MG/ML IV BOLUS
INTRAVENOUS | Status: DC | PRN
  Administered 2019-06-24: 60 mg via INTRAVENOUS

## 2019-06-24 NOTE — Anesthesia Postprocedure Evaluation (Signed)
Anesthesia Post Note  Patient: Paula Gray  Procedure(s) Performed: COLONOSCOPY WITH PROPOFOL (N/A )  Patient location during evaluation: Endoscopy Anesthesia Type: General Level of consciousness: awake and alert and oriented Pain management: pain level controlled Vital Signs Assessment: post-procedure vital signs reviewed and stable Respiratory status: spontaneous breathing, nonlabored ventilation and respiratory function stable Cardiovascular status: blood pressure returned to baseline and stable Postop Assessment: no signs of nausea or vomiting Anesthetic complications: no     Last Vitals:  Vitals:   06/24/19 1128 06/24/19 1138  BP: (!) 100/50 112/66  Pulse:    Resp:    Temp: (!) 36.1 C   SpO2:      Last Pain:  Vitals:   06/24/19 1148  TempSrc:   PainSc: 0-No pain                 Dimples Probus

## 2019-06-24 NOTE — Anesthesia Preprocedure Evaluation (Signed)
Anesthesia Evaluation  Patient identified by MRN, date of birth, ID band Patient awake    Reviewed: Allergy & Precautions, NPO status , Patient's Chart, lab work & pertinent test results  History of Anesthesia Complications Negative for: history of anesthetic complications  Airway Mallampati: II       Dental   Pulmonary neg sleep apnea, neg COPD, Not current smoker,           Cardiovascular (-) hypertension(-) Past MI and (-) CHF (-) dysrhythmias (-) Valvular Problems/Murmurs     Neuro/Psych neg Seizures    GI/Hepatic Neg liver ROS, neg GERD  ,  Endo/Other  neg diabetes  Renal/GU      Musculoskeletal   Abdominal   Peds  Hematology  (+) anemia ,   Anesthesia Other Findings   Reproductive/Obstetrics                             Anesthesia Physical Anesthesia Plan  ASA: II  Anesthesia Plan: General   Post-op Pain Management:    Induction: Intravenous  PONV Risk Score and Plan: 3 and Propofol infusion, TIVA and Ondansetron  Airway Management Planned: Nasal Cannula  Additional Equipment:   Intra-op Plan:   Post-operative Plan:   Informed Consent: I have reviewed the patients History and Physical, chart, labs and discussed the procedure including the risks, benefits and alternatives for the proposed anesthesia with the patient or authorized representative who has indicated his/her understanding and acceptance.       Plan Discussed with:   Anesthesia Plan Comments:         Anesthesia Quick Evaluation

## 2019-06-24 NOTE — Op Note (Addendum)
[facility] Gastroenterology Patient Name: Paula Gray Procedure Date: 06/24/2019 10:58 AM MRN: [id number] Account #: [id number] Date of Birth: 02/26/1956 Admit Type: Outpatient Age: 63 Room: [facility] ENDO ROOM 4 Gender: Female Note Status: Finalized Procedure:            Colonoscopy Indications:          Colon cancer screening in patient at increased risk:                        Family history of 1st-degree relative with colon polyps Providers:            Jonathon Bellows MD, MD Referring MD:         Pleas Koch (Referring MD) Medicines:            Monitored Anesthesia Care Complications:        No immediate complications. Procedure:            Pre-Anesthesia Assessment:                       - Prior to the procedure, a History and Physical was                        performed, and patient medications, allergies and                        sensitivities were reviewed. The patient's tolerance of                        previous anesthesia was reviewed.                       - The risks and benefits of the procedure and the                        sedation options and risks were discussed with the                        patient. All questions were answered and informed                        consent was obtained.                       - ASA Grade Assessment: II - A patient with mild                        systemic disease.                       After obtaining informed consent, the colonoscope was                        passed under direct vision. Throughout the procedure,                        the patient's blood pressure, pulse, and oxygen                        saturations were monitored continuously. The  Colonoscope was introduced through the anus and                        advanced to the the cecum, identified by the                        appendiceal orifice, IC valve and transillumination.                        The colonoscopy was  performed with ease. The patient                        tolerated the procedure well. The quality of the bowel                        preparation was excellent. Findings:      The perianal and digital rectal examinations were normal.      Multiple small-mouthed diverticula were found in the sigmoid colon.      Two sessile polyps were found in the cecum. The polyps were 3 to 5 mm in       size. These polyps were removed with a cold snare. Resection and       retrieval were complete.      The entire examined colon appeared normal on direct and retroflexion       views.      The exam was otherwise without abnormality on direct and retroflexion       views. Impression:           - Diverticulosis in the sigmoid colon.                       - Two 3 to 5 mm polyps in the cecum, removed with a                        cold snare. Resected and retrieved.                       - The entire examined colon is normal on direct and                        retroflexion views.                       - The examination was otherwise normal on direct and                        retroflexion views. Recommendation:       - Discharge patient to home.                       - Resume previous diet.                       - Continue present medications.                       - Await pathology results.                       - Repeat colonoscopy for surveillance based on  pathology results. Procedure Code(s):    --- Professional ---                       [phone], Colonoscopy, flexible; with removal of tumor(s),                        polyp(s), or other lesion(s) by snare technique Diagnosis Code(s):    --- Professional ---                       Z83.71, Family history of colonic polyps                       K63.5, Polyp of colon                       K57.30, Diverticulosis of large intestine without                        perforation or abscess without bleeding CPT copyright 2019 American Medical  Association. All rights reserved. The codes documented in this report are preliminary and upon coder review may  be revised to meet current compliance requirements. Jonathon Bellows, MD Jonathon Bellows MD, MD 06/24/2019 11:25:23 AM This report has been signed electronically. Number of Addenda: 0 Note Initiated On: 06/24/2019 10:58 AM Scope Withdrawal Time: 0 hours 13 minutes 56 seconds  Total Procedure Duration: 0 hours 17 minutes 52 seconds  Estimated Blood Loss: Estimated blood loss: none.      [facility]

## 2019-06-24 NOTE — Anesthesia Post-op Follow-up Note (Signed)
Anesthesia QCDR form completed.        

## 2019-06-24 NOTE — Transfer of Care (Signed)
Immediate Anesthesia Transfer of Care Note  Patient: Paula Gray  Procedure(s) Performed: COLONOSCOPY WITH PROPOFOL (N/A )  Patient Location: PACU  Anesthesia Type:General  Level of Consciousness: drowsy  Airway & Oxygen Therapy: Patient connected to nasal cannula oxygen  Post-op Assessment: Report given to RN and Post -op Vital signs reviewed and stable  Post vital signs: Reviewed and stable  Last Vitals:  Vitals Value Taken Time  BP 100/50 06/24/19 1129  Temp 36.1 C 06/24/19 1128  Pulse 85 06/24/19 1132  Resp 16 06/24/19 1132  SpO2 98 % 06/24/19 1132  Vitals shown include unvalidated device data.  Last Pain:  Vitals:   06/24/19 1128  TempSrc: Tympanic  PainSc:          Complications: No apparent anesthesia complications

## 2019-06-24 NOTE — H&P (Signed)
Jonathon Bellows, MD [address], [place], [place], [place], [address] [address], [place], [place], [place], [address] Phone: [phone]  Fax: [phone]  Primary Care Physician:  Pleas Koch, NP   Pre-Procedure History & Physical: HPI:  Paula Gray is a 63 y.o. female is here for an colonoscopy.   Past Medical History:  Diagnosis Date  . Arthritis   . Celiac disease   . Chickenpox   . Hyperlipidemia   . Migraine     Past Surgical History:  Procedure Laterality Date  . CARPAL TUNNEL RELEASE  2006  . CESAREAN SECTION  1985  . RETINAL TEAR REPAIR CRYOTHERAPY Left 2015  . TONSILLECTOMY  1962    Prior to Admission medications   Medication Sig Start Date End Date Taking? Authorizing Provider  atorvastatin (LIPITOR) 20 MG tablet Take 0.5 tablets (10 mg total) by mouth daily. For cholesterol. 10/15/18 10/15/19 Yes Pleas Koch, NP  Cholecalciferol (VITAMIN D3) 1000 units CAPS Take by mouth.   Yes [provider]  NON FORMULARY    Yes [provider]    Allergies as of 06/15/2019 - Review Complete 10/15/2018  Allergen Reaction Noted  . Iron dextran  02/14/2015  . Prednisone Other (See Comments) 05/07/2016    Family History  Problem Relation Age of Onset  . Breast cancer Maternal Aunt        great aunt  . Arthritis Maternal Grandmother   . Heart disease Maternal Grandfather   . Arthritis Paternal Grandmother   . Colon cancer Paternal Grandfather   . Skin cancer Father     Social History   Socioeconomic History  . Marital status: Divorced    Spouse name: Not on file  . Number of children: Not on file  . Years of education: Not on file  . Highest education level: Not on file  Occupational History  . Not on file  Social Needs  . Financial resource strain: Not on file  . Food insecurity    Worry: Not on file    Inability: Not on file  . Transportation needs    Medical: Not on file    Non-medical: Not on file   Tobacco Use  . Smoking status: Never Smoker  . Smokeless tobacco: Never Used  Substance and Sexual Activity  . Alcohol use: Yes    Comment: social  . Drug use: No  . Sexual activity: Not on file  Lifestyle  . Physical activity    Days per week: Not on file    Minutes per session: Not on file  . Stress: Not on file  Relationships  . Social Herbalist on phone: Not on file    Gets together: Not on file    Attends religious service: Not on file    Active member of club or organization: Not on file    Attends meetings of clubs or organizations: Not on file    Relationship status: Not on file  . Intimate partner violence    Fear of current or ex partner: Not on file    Emotionally abused: Not on file    Physically abused: Not on file    Forced sexual activity: Not on file  Other Topics Concern  . Not on file  Social History Narrative   Single.    2 children. 1 grandchild.   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     Review  of Systems: See HPI, otherwise negative ROS  Physical Exam: BP (!) 137/94   Pulse 95   Temp (!) 97 F (36.1 C) (Tympanic)   Resp 20   Ht 5\' 4"  (1.626 m)   Wt 72.6 kg   SpO2 100%   BMI 27.46 kg/m  General:   Alert,  pleasant and cooperative in NAD Head:  Normocephalic and atraumatic. Neck:  Supple; no masses or thyromegaly. Lungs:  Clear throughout to auscultation, normal respiratory effort.    Heart:  +S1, +S2, Regular rate and rhythm, No edema. Abdomen:  Soft, nontender and nondistended. Normal bowel sounds, without guarding, and without rebound.   Neurologic:  Alert and  oriented x4;  grossly normal neurologically.  Impression/Plan: Paula Gray is here for an colonoscopy to be performed for colon cancer screening , family history of colon polyps in the father.Risks, benefits, limitations, and alternatives regarding  colonoscopy have been reviewed with the patient.  Questions have been answered.   All parties agreeable.   Jonathon Bellows, MD  06/24/2019, 10:52 AM

## 2019-06-27 ENCOUNTER — Encounter: Payer: Self-pay | Admitting: Gastroenterology

## 2019-06-27 LAB — SURGICAL PATHOLOGY

## 2019-06-28 ENCOUNTER — Encounter: Payer: Self-pay | Admitting: Gastroenterology

## 2019-08-04 ENCOUNTER — Other Ambulatory Visit: Payer: Self-pay | Admitting: Obstetrics & Gynecology

## 2019-08-04 DIAGNOSIS — Z1231 Encounter for screening mammogram for malignant neoplasm of breast: Secondary | ICD-10-CM

## 2019-09-28 ENCOUNTER — Ambulatory Visit
Admission: RE | Admit: 2019-09-28 | Discharge: 2019-09-28 | Disposition: A | Discharge status: Home / Self Care | Payer: BC Managed Care – PPO | Source: Ambulatory Visit | Attending: Obstetrics & Gynecology | Admitting: Obstetrics & Gynecology

## 2019-09-28 DIAGNOSIS — Z1231 Encounter for screening mammogram for malignant neoplasm of breast: Secondary | ICD-10-CM | POA: Diagnosis not present

## 2019-11-07 ENCOUNTER — Other Ambulatory Visit: Payer: Self-pay

## 2019-11-07 ENCOUNTER — Ambulatory Visit: Payer: 59 | Admitting: Primary Care

## 2019-11-07 ENCOUNTER — Encounter: Payer: Self-pay | Admitting: Primary Care

## 2019-11-07 DIAGNOSIS — E785 Hyperlipidemia, unspecified: Secondary | ICD-10-CM | POA: Diagnosis not present

## 2019-11-07 DIAGNOSIS — H04123 Dry eye syndrome of bilateral lacrimal glands: Secondary | ICD-10-CM

## 2019-11-07 NOTE — Assessment & Plan Note (Signed)
Recommended to break from computer screens often, also using screen protective glasses. She will also set up a visit with her eye doctor. Exam today without obvious problem.

## 2019-11-07 NOTE — Patient Instructions (Signed)
Rosuvastatin (Crestor) or pravastatin (Pravachol) for cholesterol medications.  Touch base with your eye doctor as discussed.  It was a pleasure to see you today!

## 2019-11-07 NOTE — Progress Notes (Signed)
Subjective:    Patient ID: Paula Gray, female    DOB: Feb 10, 1956, 64 y.o.   MRN: [id number]  HPI  This visit occurred during the SARS-CoV-2 public health emergency.  Safety protocols were in place, including screening questions prior to the visit, additional usage of staff PPE, and extensive cleaning of exam room while observing appropriate contact time as indicated for disinfecting solutions.   Paula Gray is a 64 year old female with a history of celiac disease, hyperlipidemia, iron deficiency anemia who presents today with a chief complaint of dry and red eyes.   History of dry and red eyes last year which improved after iron infusions. Recently she's noticed intermittent red and dry eyes when waking. Will wake with dry eyes having to blink in order to moisten. She's tried several OTC products with improvement in redness and moisture. She was curious to know if there was a better product.   She denies itching, drainage, visual changes. She is not taking antihistamines. She endorses proper hydration. She does have an eye doctor, last visit was nearly 2 years ago. She stares at three computer screens during the day.  Recent blood draw completed per hematology, hemoglobin of 14.6 with hematocrit of 41.5. TIBC of 353. Her last infusion was in August 2020.  She saw her OB/GYN last week which was 128/88.   BP Readings from Last 3 Encounters:  11/07/19 140/84  06/24/19 112/66  10/15/18 120/78   Wt Readings from Last 3 Encounters:  11/07/19 161 lb (73 kg)  06/24/19 160 lb (72.6 kg)  10/15/18 158 lb 8 oz (71.9 kg)     Review of Systems  HENT: Negative for rhinorrhea.   Eyes: Positive for redness. Negative for discharge and itching.       Dry eyes       Past Medical History:  Diagnosis Date  . Arthritis   . Celiac disease   . Chickenpox   . Hyperlipidemia   . Migraine      Social History   Socioeconomic History  . Marital status: Divorced    Spouse name: Not on file    . Number of children: Not on file  . Years of education: Not on file  . Highest education level: Not on file  Occupational History  . Not on file  Tobacco Use  . Smoking status: Never Smoker  . Smokeless tobacco: Never Used  Substance and Sexual Activity  . Alcohol use: Yes    Comment: social  . Drug use: No  . Sexual activity: Not on file  Other Topics Concern  . Not on file  Social History Narrative   Single.    2 children. 1 grandchild.   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    Social Determinants of Health   Financial Resource Strain:   . Difficulty of Paying Living Expenses: Not on file  Food Insecurity:   . Worried About Charity fundraiser in the Last Year: Not on file  . Ran Out of Food in the Last Year: Not on file  Transportation Needs:   . Lack of Transportation (Medical): Not on file  . Lack of Transportation (Non-Medical): Not on file  Physical Activity:   . Days of Exercise per Week: Not on file  . Minutes of Exercise per Session: Not on file  Stress:   . Feeling of Stress : Not on file  Social Connections:   . Frequency of Communication  with Friends and Family: Not on file  . Frequency of Social Gatherings with Friends and Family: Not on file  . Attends Religious Services: Not on file  . Active Member of Clubs or Organizations: Not on file  . Attends Archivist Meetings: Not on file  . Marital Status: Not on file  Intimate Partner Violence:   . Fear of Current or Ex-Partner: Not on file  . Emotionally Abused: Not on file  . Physically Abused: Not on file  . Sexually Abused: Not on file    Past Surgical History:  Procedure Laterality Date  . CARPAL TUNNEL RELEASE  2006  . CESAREAN SECTION  1985  . COLONOSCOPY WITH PROPOFOL N/A 06/24/2019   Procedure: COLONOSCOPY WITH PROPOFOL;  Surgeon: Jonathon Bellows, MD;  Location: [facility] ENDOSCOPY;  Service: Gastroenterology;  Laterality: N/A;  . RETINAL TEAR REPAIR  CRYOTHERAPY Left 2015  . TONSILLECTOMY  1962    Family History  Problem Relation Age of Onset  . Arthritis Maternal Grandmother   . Heart disease Maternal Grandfather   . Arthritis Paternal Grandmother   . Colon cancer Paternal Grandfather   . Skin cancer Father   . Breast cancer Other     Allergies  Allergen Reactions  . Iron Dextran   . Nsaids   . Prednisone Other (See Comments)    History of celiac disease    Current Outpatient Medications on File Prior to Visit  Medication Sig Dispense Refill  . atorvastatin (LIPITOR) 20 MG tablet Take 0.5 tablets (10 mg total) by mouth daily. For cholesterol. 45 tablet 0  . Cholecalciferol (VITAMIN D3) 1000 units CAPS Take by mouth.    . NON FORMULARY      No current facility-administered medications on file prior to visit.    BP 140/84   Pulse 83   Temp (!) 96.5 F (35.8 C) (Temporal)   Ht 5\' 4"  (1.626 m)   Wt 161 lb (73 kg)   SpO2 98%   BMI 27.64 kg/m    Objective:   Physical Exam  Eyes: Pupils are equal, round, and reactive to light. Conjunctivae are normal. Right eye exhibits no discharge. Left eye exhibits no discharge. Right conjunctiva is not injected. Left conjunctiva is not injected.  Cardiovascular: Normal rate.  Respiratory: Effort normal.  Skin: Skin is warm and dry.           Assessment & Plan:

## 2019-11-07 NOTE — Assessment & Plan Note (Signed)
Stopped atorvastatin quite some time ago due to myalgias. Discussed options such as Crestor and pravastatin. She will try to find gluten free products and update.

## 2019-12-15 ENCOUNTER — Ambulatory Visit: Payer: 59

## 2019-12-15 ENCOUNTER — Ambulatory Visit: Payer: 59 | Attending: Internal Medicine

## 2019-12-15 DIAGNOSIS — Z23 Encounter for immunization: Secondary | ICD-10-CM

## 2019-12-15 NOTE — Progress Notes (Signed)
   Covid-19 Vaccination Clinic  Name:  Paula Gray    MRN: [id number] DOB: 05/17/1956  12/15/2019  [name] was observed post Covid-19 immunization for 15 minutes without incident. She was provided with Vaccine Information Sheet and instruction to access the V-Safe system.   [name] was instructed to call 911 with any severe reactions post vaccine: Marland Kitchen Difficulty breathing  . Swelling of face and throat  . A fast heartbeat  . A bad rash all over body  . Dizziness and weakness   Immunizations Administered    Name Date Dose VIS Date Route   Pfizer COVID-19 Vaccine 12/15/2019 11:35 AM 0.3 mL 09/09/2019 Intramuscular   Manufacturer: [employer]   Lot: [id number]   [place]: [id number]

## 2020-01-11 ENCOUNTER — Ambulatory Visit: Payer: 59 | Attending: Internal Medicine

## 2020-01-11 DIAGNOSIS — Z23 Encounter for immunization: Secondary | ICD-10-CM

## 2020-01-11 NOTE — Progress Notes (Signed)
   Covid-19 Vaccination Clinic  Name:  Paula Gray    MRN: [id number] DOB: 08/30/1956  01/11/2020  [name] was observed post Covid-19 immunization for 15 minutes without incident. She was provided with Vaccine Information Sheet and instruction to access the V-Safe system.   [name] was instructed to call 911 with any severe reactions post vaccine: Marland Kitchen Difficulty breathing  . Swelling of face and throat  . A fast heartbeat  . A bad rash all over body  . Dizziness and weakness   Immunizations Administered    Name Date Dose VIS Date Route   Pfizer COVID-19 Vaccine 01/11/2020  4:36 PM 0.3 mL 09/09/2019 Intramuscular   Manufacturer: [place]   Lot: [id number]   [place]: [id number]

## 2020-09-20 ENCOUNTER — Other Ambulatory Visit: Payer: Self-pay | Admitting: Obstetrics & Gynecology

## 2020-10-09 ENCOUNTER — Other Ambulatory Visit: Payer: Self-pay | Admitting: Obstetrics & Gynecology

## 2020-10-09 ENCOUNTER — Other Ambulatory Visit: Payer: Self-pay

## 2020-11-16 ENCOUNTER — Other Ambulatory Visit: Payer: Self-pay

## 2020-11-16 ENCOUNTER — Ambulatory Visit (INDEPENDENT_AMBULATORY_CARE_PROVIDER_SITE_OTHER): Payer: No Typology Code available for payment source | Admitting: Primary Care

## 2020-11-16 ENCOUNTER — Ambulatory Visit
Admission: RE | Admit: 2020-11-16 | Discharge: 2020-11-16 | Disposition: A | Discharge status: Home / Self Care | Payer: No Typology Code available for payment source | Source: Ambulatory Visit | Attending: Primary Care | Admitting: Primary Care

## 2020-11-16 ENCOUNTER — Encounter: Payer: Self-pay | Admitting: Primary Care

## 2020-11-16 ENCOUNTER — Other Ambulatory Visit: Payer: Self-pay | Admitting: Primary Care

## 2020-11-16 VITALS — BP 116/78 | HR 82 | Temp 98.3°F | Ht 64.0 in | Wt 152.0 lb

## 2020-11-16 DIAGNOSIS — Z23 Encounter for immunization: Secondary | ICD-10-CM

## 2020-11-16 DIAGNOSIS — E785 Hyperlipidemia, unspecified: Secondary | ICD-10-CM | POA: Diagnosis not present

## 2020-11-16 DIAGNOSIS — Z0000 Encounter for general adult medical examination without abnormal findings: Secondary | ICD-10-CM | POA: Diagnosis not present

## 2020-11-16 DIAGNOSIS — G8929 Other chronic pain: Secondary | ICD-10-CM

## 2020-11-16 DIAGNOSIS — M5441 Lumbago with sciatica, right side: Secondary | ICD-10-CM

## 2020-11-16 DIAGNOSIS — Z114 Encounter for screening for human immunodeficiency virus [HIV]: Secondary | ICD-10-CM

## 2020-11-16 DIAGNOSIS — Z1231 Encounter for screening mammogram for malignant neoplasm of breast: Secondary | ICD-10-CM | POA: Insufficient documentation

## 2020-11-16 DIAGNOSIS — R7303 Prediabetes: Secondary | ICD-10-CM

## 2020-11-16 DIAGNOSIS — D509 Iron deficiency anemia, unspecified: Secondary | ICD-10-CM

## 2020-11-16 DIAGNOSIS — K900 Celiac disease: Secondary | ICD-10-CM

## 2020-11-16 DIAGNOSIS — H04123 Dry eye syndrome of bilateral lacrimal glands: Secondary | ICD-10-CM

## 2020-11-16 DIAGNOSIS — M549 Dorsalgia, unspecified: Secondary | ICD-10-CM | POA: Insufficient documentation

## 2020-11-16 LAB — LIPID PANEL
Cholesterol: 187 mg/dL (ref 0–200)
HDL: 36.2 mg/dL — ABNORMAL LOW (ref >39.00)
LDL Cholesterol: 115 mg/dL — ABNORMAL HIGH (ref 0–99)
NonHDL: 150.37
Total CHOL/HDL Ratio: 5
Triglycerides: 175 mg/dL — ABNORMAL HIGH (ref 0.0–149.0)
VLDL: 35 mg/dL (ref 0.0–40.0)

## 2020-11-16 LAB — COMPREHENSIVE METABOLIC PANEL
ALT: 19 U/L (ref 0–35)
AST: 19 U/L (ref 0–37)
Albumin: 4.4 g/dL (ref 3.5–5.2)
Alkaline Phosphatase: 60 U/L (ref 39–117)
BUN: 15 mg/dL (ref 6–23)
CO2: 30 mEq/L (ref 19–32)
Calcium: 9.7 mg/dL (ref 8.4–10.5)
Chloride: 105 mEq/L (ref 96–112)
Creatinine, Ser: 0.92 mg/dL (ref 0.40–1.20)
GFR: 66.33 mL/min (ref >60.00)
Glucose, Bld: 119 mg/dL — ABNORMAL HIGH (ref 70–99)
Potassium: 4.3 mEq/L (ref 3.5–5.1)
Sodium: 140 mEq/L (ref 135–145)
Total Bilirubin: 0.8 mg/dL (ref 0.2–1.2)
Total Protein: 7.1 g/dL (ref 6.0–8.3)

## 2020-11-16 LAB — HEMOGLOBIN A1C: Hgb A1c MFr Bld: 6.1 % (ref 4.6–6.5)

## 2020-11-16 NOTE — Assessment & Plan Note (Addendum)
Chronic and intermittent, recent flare and evaluated by orthopedics. Improving.  Discussed importance of stretching and walking daily. Continue to monitor.

## 2020-11-16 NOTE — Assessment & Plan Note (Signed)
First Shingrix dose provided today. Other vaccines UTD. Pap smear UTD per patient, follows with GYN. Mammogram due, ordered today. Colonoscopy UTD, due in 2025.  Discussed the importance of a healthy diet and regular exercise in order for weight loss, and to reduce the risk of any potential medical problems.  Exam today unremarkable.  Labs pending.

## 2020-11-16 NOTE — Assessment & Plan Note (Signed)
Discussed the importance of a healthy diet and regular exercise in order for weight loss, and to reduce the risk of any potential medical problems.  Repeat lipid panel pending.

## 2020-11-16 NOTE — Assessment & Plan Note (Signed)
Stable, follows with Hematology solely for iron infusions.   Continue to monitor.

## 2020-11-16 NOTE — Assessment & Plan Note (Signed)
Improved, follows with optometry.

## 2020-11-16 NOTE — Patient Instructions (Signed)
Stop by the lab prior to leaving today. I will notify you of your results once received.   Start exercising. You should be getting 150 minutes of exercise weekly. Remember to stretch your lower back daily.  You are due for your second shingrix vaccine in 2-6 months.  It was a pleasure to see you today!   Preventive Care 19-64 Years Old, Female Preventive care refers to lifestyle choices and visits with your health care provider that can promote health and wellness. This includes:  A yearly physical exam. This is also called an annual wellness visit.  Regular dental and eye exams.  Immunizations.  Screening for certain conditions.  Healthy lifestyle choices, such as: ? Eating a healthy diet. ? Getting regular exercise. ? Not using drugs or products that contain nicotine and tobacco. ? Limiting alcohol use. What can I expect for my preventive care visit? Physical exam Your health care provider will check your:  Height and weight. These may be used to calculate your BMI (body mass index). BMI is a measurement that tells if you are at a healthy weight.  Heart rate and blood pressure.  Body temperature.  Skin for abnormal spots. Counseling Your health care provider may ask you questions about your:  Past medical problems.  Family's medical history.  Alcohol, tobacco, and drug use.  Emotional well-being.  Home life and relationship well-being.  Sexual activity.  Diet, exercise, and sleep habits.  Work and work Astronomer.  Access to firearms.  Method of birth control.  Menstrual cycle.  Pregnancy history. What immunizations do I need? Vaccines are usually given at various ages, according to a schedule. Your health care provider will recommend vaccines for you based on your age, medical history, and lifestyle or other factors, such as travel or where you work.   What tests do I need? Blood tests  Lipid and cholesterol levels. These may be checked every 5  years, or more often if you are over 32 years old.  Hepatitis C test.  Hepatitis B test. Screening  Lung cancer screening. You may have this screening every year starting at age 62 if you have a 30-pack-year history of smoking and currently smoke or have quit within the past 15 years.  Colorectal cancer screening. ? All adults should have this screening starting at age 12 and continuing until age 35. ? Your health care provider may recommend screening at age 34 if you are at increased risk. ? You will have tests every 1-10 years, depending on your results and the type of screening test.  Diabetes screening. ? This is done by checking your blood sugar (glucose) after you have not eaten for a while (fasting). ? You may have this done every 1-3 years.  Mammogram. ? This may be done every 1-2 years. ? Talk with your health care provider about when you should start having regular mammograms. This may depend on whether you have a family history of breast cancer.  BRCA-related cancer screening. This may be done if you have a family history of breast, ovarian, tubal, or peritoneal cancers.  Pelvic exam and Pap test. ? This may be done every 3 years starting at age 26. ? Starting at age 75, this may be done every 5 years if you have a Pap test in combination with an HPV test. Other tests  STD (sexually transmitted disease) testing, if you are at risk.  Bone density scan. This is done to screen for osteoporosis. You may have this scan  if you are at high risk for osteoporosis. Talk with your health care provider about your test results, treatment options, and if necessary, the need for more tests. Follow these instructions at home: Eating and drinking  Eat a diet that includes fresh fruits and vegetables, whole grains, lean protein, and low-fat dairy products.  Take vitamin and mineral supplements as recommended by your health care provider.  Do not drink alcohol if: ? Your health care  provider tells you not to drink. ? You are pregnant, may be pregnant, or are planning to become pregnant.  If you drink alcohol: ? Limit how much you have to 0-1 drink a day. ? Be aware of how much alcohol is in your drink. In the [place] one drink equals one 12 oz bottle of beer (355 mL), one 5 oz glass of wine (148 mL), or one 1 oz glass of hard liquor (44 mL).   Lifestyle  Take daily care of your teeth and gums. Brush your teeth every morning and night with fluoride toothpaste. Floss one time each day.  Stay active. Exercise for at least 30 minutes 5 or more days each week.  Do not use any products that contain nicotine or tobacco, such as cigarettes, e-cigarettes, and chewing tobacco. If you need help quitting, ask your health care provider.  Do not use drugs.  If you are sexually active, practice safe sex. Use a condom or other form of protection to prevent STIs (sexually transmitted infections).  If you do not wish to become pregnant, use a form of birth control. If you plan to become pregnant, see your health care provider for a prepregnancy visit.  If told by your health care provider, take low-dose aspirin daily starting at age 53.  Find healthy ways to cope with stress, such as: ? Meditation, yoga, or listening to music. ? Journaling. ? Talking to a trusted person. ? Spending time with friends and family. Safety  Always wear your seat belt while driving or riding in a vehicle.  Do not drive: ? If you have been drinking alcohol. Do not ride with someone who has been drinking. ? When you are tired or distracted. ? While texting.  Wear a helmet and other protective equipment during sports activities.  If you have firearms in your house, make sure you follow all gun safety procedures. What's next?  Visit your health care provider once a year for an annual wellness visit.  Ask your health care provider how often you should have your eyes and teeth checked.  Stay up to  date on all vaccines. This information is not intended to replace advice given to you by your health care provider. Make sure you discuss any questions you have with your health care provider. Document Revised: 06/19/2020 Document Reviewed: 05/27/2018 Elsevier Patient Education  2021 [employer].

## 2020-11-16 NOTE — Addendum Note (Signed)
Addended by: Francella Solian on: 11/16/2020 08:40 AM   Modules accepted: Orders

## 2020-11-16 NOTE — Progress Notes (Signed)
Subjective:    Patient ID: Paula Gray, female    DOB: August 13, 1956, 65 y.o.   MRN: [id number]  HPI  This visit occurred during the SARS-CoV-2 public health emergency.  Safety protocols were in place, including screening questions prior to the visit, additional usage of staff PPE, and extensive cleaning of exam room while observing appropriate contact time as indicated for disinfecting solutions.   Paula Gray is a 65 year old female who presents today for complete physical.  Immunizations: -Tetanus: 2017 -Influenza: Completed this season  -Shingles: Never completed  -Pneumonia: Pneumovax 2021 -Covid-19: Completed three vaccines.  Diet: She endorses a fair diet.  Exercise: No regular exercise   Eye exam: Completes regularly   Dental exam: Completes semi-annually   Pap Smear: UTD per patient, follows with GYN.  Mammogram: Due, pending Colonoscopy: 2020, due 2025 Hep C Screen: Negative  BP Readings from Last 3 Encounters:  11/16/20 116/78  11/07/19 140/84  06/24/19 112/66   Wt Readings from Last 3 Encounters:  11/16/20 152 lb (68.9 kg)  11/07/19 161 lb (73 kg)  06/24/19 160 lb (72.6 kg)     Review of Systems  Constitutional: Negative for unexpected weight change.  HENT: Negative for rhinorrhea.   Eyes:       Chronic dry eyes, have improved  Respiratory: Negative for cough and shortness of breath.   Cardiovascular: Negative for chest pain.  Gastrointestinal: Negative for constipation and diarrhea.  Genitourinary: Negative for difficulty urinating.  Musculoskeletal: Positive for arthralgias and back pain.  Skin: Negative for rash.  Allergic/Immunologic: Negative for environmental allergies.  Neurological: Negative for dizziness, numbness and headaches.  Psychiatric/Behavioral: The patient is not nervous/anxious.        Past Medical History:  Diagnosis Date  . Arthritis   . Celiac disease   . Chickenpox   . Hyperlipidemia   . Migraine      Social  History   Socioeconomic History  . Marital status: Divorced    Spouse name: Not on file  . Number of children: Not on file  . Years of education: Not on file  . Highest education level: Not on file  Occupational History  . Not on file  Tobacco Use  . Smoking status: Never Smoker  . Smokeless tobacco: Never Used  Vaping Use  . Vaping Use: Never used  Substance and Sexual Activity  . Alcohol use: Yes    Comment: social  . Drug use: No  . Sexual activity: Not on file  Other Topics Concern  . Not on file  Social History Narrative   Single.    2 children. 1 grandchild.   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    Social Determinants of Health   Financial Resource Strain: Not on file  Food Insecurity: Not on file  Transportation Needs: Not on file  Physical Activity: Not on file  Stress: Not on file  Social Connections: Not on file  Intimate Partner Violence: Not on file    Past Surgical History:  Procedure Laterality Date  . CARPAL TUNNEL RELEASE  2006  . CESAREAN SECTION  1985  . COLONOSCOPY WITH PROPOFOL N/A 06/24/2019   Procedure: COLONOSCOPY WITH PROPOFOL;  Surgeon: Jonathon Bellows, MD;  Location: [facility] ENDOSCOPY;  Service: Gastroenterology;  Laterality: N/A;  . RETINAL TEAR REPAIR CRYOTHERAPY Left 2015  . TONSILLECTOMY  1962    Family History  Problem Relation Age of Onset  . Arthritis Maternal Grandmother   .  Heart disease Maternal Grandfather   . Arthritis Paternal Grandmother   . Colon cancer Paternal Grandfather   . Skin cancer Father   . Breast cancer Other     Allergies  Allergen Reactions  . Iron Dextran   . Nsaids   . Prednisone Other (See Comments)    History of celiac disease    Current Outpatient Medications on File Prior to Visit  Medication Sig Dispense Refill  . Cholecalciferol (VITAMIN D3) 1000 units CAPS Take by mouth.    . NON FORMULARY      No current facility-administered medications on file prior to  visit.    BP 116/78   Pulse 82   Temp 98.3 F (36.8 C) (Temporal)   Ht 5\' 4"  (1.626 m)   Wt 152 lb (68.9 kg)   SpO2 98%   BMI 26.09 kg/m    Objective:   Physical Exam Constitutional:      Appearance: She is well-nourished.  HENT:     Right Ear: Tympanic membrane and ear canal normal.     Left Ear: Tympanic membrane and ear canal normal.     Mouth/Throat:     Mouth: Oropharynx is clear and moist.  Eyes:     Extraocular Movements: EOM normal.     Pupils: Pupils are equal, round, and reactive to light.  Cardiovascular:     Rate and Rhythm: Normal rate and regular rhythm.  Pulmonary:     Effort: Pulmonary effort is normal.     Breath sounds: Normal breath sounds.  Abdominal:     General: Bowel sounds are normal.     Palpations: Abdomen is soft.     Tenderness: There is no abdominal tenderness.  Musculoskeletal:        General: Normal range of motion.     Cervical back: Neck supple.  Skin:    General: Skin is warm and dry.  Neurological:     Mental Status: She is alert and oriented to person, place, and time.     Cranial Nerves: No cranial nerve deficit.     Deep Tendon Reflexes:     Reflex Scores:      Patellar reflexes are 2+ on the right side and 2+ on the left side. Psychiatric:        Mood and Affect: Mood and affect and mood normal.            Assessment & Plan:

## 2020-11-16 NOTE — Assessment & Plan Note (Addendum)
Follows with hematology, no recent iron infusion.  Recent labs reviewed.

## 2020-11-17 LAB — HIV ANTIBODY (ROUTINE TESTING W REFLEX): HIV 1&2 Ab, 4th Generation: NONREACTIVE

## 2020-11-19 ENCOUNTER — Encounter: Payer: 59 | Admitting: Primary Care

## 2020-12-05 ENCOUNTER — Telehealth: Payer: Self-pay | Admitting: *Deleted

## 2020-12-05 NOTE — Telephone Encounter (Signed)
Patient called stating that she was in on 11/16/20 for her physical with Allie Bossier NP.  Patient stated that she is now being charged for lab work that she should have not been. Patient stated that she called the Billing Department and they told her to call the office.

## 2020-12-06 NOTE — Telephone Encounter (Signed)
I have forwarded the information over to the billing department. I called and explained to the patient that unfortunately, we do not handle billing or claims in our office. She stated that she called billing and they told her to call our office. I apologized and advised her that I was aware that is the advice that billing is giving patients when they call in. I let her know that I sent it to leadership in billing and have requested that they let me know once they reach out to her. If I do not receive correspondence by the middle of next week, I will reach out to request an update. Patient verbalized understanding and thanked me.

## 2020-12-18 NOTE — Telephone Encounter (Addendum)
Pt left VM at [facility] asking to speak directly with Joellen about her CPE in Feb, given this note I called pt back and no answer so left VM letting pt know Joellen is not available (in Clinic), and request that she call the office back and let us know what she needs. FYI to [employer] it's regarding this same issue and also sent to [facility]

## 2020-12-18 NOTE — Telephone Encounter (Signed)
Can you take a look at this patient states that the lipid panel is not getting covered. Please advise

## 2021-01-21 NOTE — Telephone Encounter (Signed)
Patient LVM on triage nurse line wanting to follow-up on this issue regarding a bill that she keeps receiving. Please advise.

## 2021-10-10 ENCOUNTER — Other Ambulatory Visit: Payer: Self-pay

## 2021-10-10 DIAGNOSIS — Z1231 Encounter for screening mammogram for malignant neoplasm of breast: Secondary | ICD-10-CM

## 2021-11-18 ENCOUNTER — Ambulatory Visit
Admission: RE | Admit: 2021-11-18 | Discharge: 2021-11-18 | Disposition: A | Discharge status: Home / Self Care | Payer: No Typology Code available for payment source | Source: Ambulatory Visit

## 2021-11-18 ENCOUNTER — Other Ambulatory Visit: Payer: Self-pay

## 2021-11-18 DIAGNOSIS — Z1231 Encounter for screening mammogram for malignant neoplasm of breast: Secondary | ICD-10-CM | POA: Insufficient documentation

## 2021-12-16 LAB — HM DIABETES EYE EXAM

## 2022-01-14 ENCOUNTER — Ambulatory Visit (INDEPENDENT_AMBULATORY_CARE_PROVIDER_SITE_OTHER): Payer: No Typology Code available for payment source | Admitting: Primary Care

## 2022-01-14 ENCOUNTER — Encounter: Payer: Self-pay | Admitting: Primary Care

## 2022-01-14 VITALS — BP 134/76 | HR 76 | Temp 98.6°F | Ht 64.0 in | Wt 163.0 lb

## 2022-01-14 DIAGNOSIS — K900 Celiac disease: Secondary | ICD-10-CM

## 2022-01-14 DIAGNOSIS — R7303 Prediabetes: Secondary | ICD-10-CM | POA: Diagnosis not present

## 2022-01-14 DIAGNOSIS — Z0000 Encounter for general adult medical examination without abnormal findings: Secondary | ICD-10-CM | POA: Diagnosis not present

## 2022-01-14 DIAGNOSIS — E785 Hyperlipidemia, unspecified: Secondary | ICD-10-CM

## 2022-01-14 DIAGNOSIS — D509 Iron deficiency anemia, unspecified: Secondary | ICD-10-CM

## 2022-01-14 LAB — COMPREHENSIVE METABOLIC PANEL
ALT: 27 U/L (ref 0–35)
AST: 21 U/L (ref 0–37)
Albumin: 4.7 g/dL (ref 3.5–5.2)
Alkaline Phosphatase: 73 U/L (ref 39–117)
BUN: 15 mg/dL (ref 6–23)
CO2: 28 mEq/L (ref 19–32)
Calcium: 9.5 mg/dL (ref 8.4–10.5)
Chloride: 105 mEq/L (ref 96–112)
Creatinine, Ser: 0.84 mg/dL (ref 0.40–1.20)
GFR: 73.38 mL/min (ref >60.00)
Glucose, Bld: 116 mg/dL — ABNORMAL HIGH (ref 70–99)
Potassium: 4.6 mEq/L (ref 3.5–5.1)
Sodium: 140 mEq/L (ref 135–145)
Total Bilirubin: 0.8 mg/dL (ref 0.2–1.2)
Total Protein: 7.3 g/dL (ref 6.0–8.3)

## 2022-01-14 LAB — HEMOGLOBIN A1C: Hgb A1c MFr Bld: 7.2 % — ABNORMAL HIGH (ref 4.6–6.5)

## 2022-01-14 LAB — LIPID PANEL
Cholesterol: 195 mg/dL (ref 0–200)
HDL: 38.5 mg/dL — ABNORMAL LOW (ref >39.00)
LDL Cholesterol: 122 mg/dL — ABNORMAL HIGH (ref 0–99)
NonHDL: 156.77
Total CHOL/HDL Ratio: 5
Triglycerides: 173 mg/dL — ABNORMAL HIGH (ref 0.0–149.0)
VLDL: 34.6 mg/dL (ref 0.0–40.0)

## 2022-01-14 NOTE — Progress Notes (Signed)
? ?Subjective:  ? ? Patient ID: Paula Gray, female    DOB: 24-Jul-1956, 66 y.o.   MRN: [id number] ? ?HPI ? ?Paula Gray is a very pleasant 66 y.o. female who presents today for complete physical and follow up of chronic conditions. ? ?Immunizations: ?-Tetanus: 2017 ?-Influenza: Did not complete last season ?-Covid-19: 3 vaccines ?-Shingles: Completed Shingrix series ?-Pneumonia: Pneumovax 23 in 2021 ? ?Diet: Fair diet.  ?Exercise: No regular exercise. ? ?Eye exam: Completes annually  ?Dental exam: Completes semi-annually  ? ?Pap Smear: Completed per GYN. ?Mammogram: Completed in February 2023 ?Colonoscopy: Completed in 2020, due 2025 ? ? ?BP Readings from Last 3 Encounters:  ?01/14/22 134/76  ?11/16/20 116/78  ?11/07/19 140/84  ? ? ? ? ?Review of Systems  ?Constitutional:  Negative for unexpected weight change.  ?HENT:  Negative for rhinorrhea.   ?Respiratory:  Negative for cough and shortness of breath.   ?Cardiovascular:  Negative for chest pain.  ?Gastrointestinal:  Negative for constipation and diarrhea.  ?Genitourinary:  Negative for difficulty urinating.  ?Musculoskeletal:  Negative for arthralgias and myalgias.  ?Skin:  Negative for rash.  ?Allergic/Immunologic: Negative for environmental allergies.  ?Neurological:  Negative for dizziness and headaches.  ?Psychiatric/Behavioral:  The patient is not nervous/anxious.   ? ?   ? ? ?Past Medical History:  ?Diagnosis Date  ? Arthritis   ? Celiac disease   ? Chickenpox   ? Hyperlipidemia   ? Migraine   ? ? ?Social History  ? ?Socioeconomic History  ? Marital status: Divorced  ?  Spouse name: Not on file  ? Number of children: Not on file  ? Years of education: Not on file  ? Highest education level: Not on file  ?Occupational History  ? Not on file  ?Tobacco Use  ? Smoking status: Never  ? Smokeless tobacco: Never  ?Vaping Use  ? Vaping Use: Never used  ?Substance and Sexual Activity  ? Alcohol use: Yes  ?  Comment: social  ? Drug use: No  ? Sexual  activity: Not on file  ?Other Topics Concern  ? Not on file  ?Social History Narrative  ? Single.   ? 2 children. 1 grandchild.  ? Works as a Cabin crew.  ? 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   ? ?Social Determinants of Health  ? ?Financial Resource Strain: Not on file  ?Food Insecurity: Not on file  ?Transportation Needs: Not on file  ?Physical Activity: Not on file  ?Stress: Not on file  ?Social Connections: Not on file  ?Intimate Partner Violence: Not on file  ? ? ?Past Surgical History:  ?Procedure Laterality Date  ? CARPAL TUNNEL RELEASE  2006  ? [place]  ? COLONOSCOPY WITH PROPOFOL N/A 06/24/2019  ? Procedure: COLONOSCOPY WITH PROPOFOL;  Surgeon: Jonathon Bellows, MD;  Location: [facility] ENDOSCOPY;  Service: Gastroenterology;  Laterality: N/A;  ? RETINAL TEAR REPAIR CRYOTHERAPY Left 2015  ? TONSILLECTOMY  1962  ? ? ?Family History  ?Problem Relation Age of Onset  ? Arthritis Maternal Grandmother   ? Heart disease Maternal Grandfather   ? Arthritis Paternal Grandmother   ? Colon cancer Paternal Grandfather   ? Skin cancer Father   ? Breast cancer Other   ? ? ?Allergies  ?Allergen Reactions  ? Iron Dextran   ? Nsaids   ? Prednisone Other (See Comments)  ?  History of celiac disease  ? ? ?Current Outpatient Medications on File Prior to Visit  ?Medication Sig Dispense  Refill  ? Cholecalciferol (VITAMIN D3) 1000 units CAPS Take by mouth.    ? NON FORMULARY     ? ?No current facility-administered medications on file prior to visit.  ? ? ?BP 134/76   Pulse 76   Temp 98.6 ?F (37 ?C) (Oral)   Ht '5\' 4"'$  (1.626 m)   Wt 163 lb (73.9 kg)   SpO2 99%   BMI 27.98 kg/m?  ?Objective:  ? Physical Exam ?HENT:  ?   Right Ear: Tympanic membrane and ear canal normal.  ?   Left Ear: Tympanic membrane and ear canal normal.  ?   Nose: Nose normal.  ?Eyes:  ?   Conjunctiva/sclera: Conjunctivae normal.  ?   Pupils: Pupils are equal, round, and reactive to light.  ?Neck:  ?   Thyroid: No thyromegaly.  ?Cardiovascular:   ?   Rate and Rhythm: Normal rate and regular rhythm.  ?   Heart sounds: No murmur heard. ?Pulmonary:  ?   Effort: Pulmonary effort is normal.  ?   Breath sounds: Normal breath sounds. No rales.  ?Abdominal:  ?   General: Bowel sounds are normal.  ?   Palpations: Abdomen is soft.  ?   Tenderness: There is no abdominal tenderness.  ?Musculoskeletal:     ?   General: Normal range of motion.  ?   Cervical back: Neck supple.  ?Lymphadenopathy:  ?   Cervical: No cervical adenopathy.  ?Skin: ?   General: Skin is warm and dry.  ?   Findings: No rash.  ?Neurological:  ?   Mental Status: She is alert and oriented to person, place, and time.  ?   Cranial Nerves: No cranial nerve deficit.  ?   Deep Tendon Reflexes: Reflexes are normal and symmetric.  ?Psychiatric:     ?   Mood and Affect: Mood normal.  ? ? ? ? ? ?   ?Assessment & Plan:  ? ? ? ? ?This visit occurred during the SARS-CoV-2 public health emergency.  Safety protocols were in place, including screening questions prior to the visit, additional usage of staff PPE, and extensive cleaning of exam room while observing appropriate contact time as indicated for disinfecting solutions.  ?

## 2022-01-14 NOTE — Patient Instructions (Signed)
Stop by the lab prior to leaving today. I will notify you of your results once received.   It was a pleasure to see you today!  Preventive Care 40-64 Years Old, Female Preventive care refers to lifestyle choices and visits with your health care provider that can promote health and wellness. Preventive care visits are also called wellness exams. What can I expect for my preventive care visit? Counseling Your health care provider may ask you questions about your: Medical history, including: Past medical problems. Family medical history. Pregnancy history. Current health, including: Menstrual cycle. Method of birth control. Emotional well-being. Home life and relationship well-being. Sexual activity and sexual health. Lifestyle, including: Alcohol, nicotine or tobacco, and drug use. Access to firearms. Diet, exercise, and sleep habits. Work and work environment. Sunscreen use. Safety issues such as seatbelt and bike helmet use. Physical exam Your health care provider will check your: Height and weight. These may be used to calculate your BMI (body mass index). BMI is a measurement that tells if you are at a healthy weight. Waist circumference. This measures the distance around your waistline. This measurement also tells if you are at a healthy weight and may help predict your risk of certain diseases, such as type 2 diabetes and high blood pressure. Heart rate and blood pressure. Body temperature. Skin for abnormal spots. What immunizations do I need?  Vaccines are usually given at various ages, according to a schedule. Your health care provider will recommend vaccines for you based on your age, medical history, and lifestyle or other factors, such as travel or where you work. What tests do I need? Screening Your health care provider may recommend screening tests for certain conditions. This may include: Lipid and cholesterol levels. Diabetes screening. This is done by checking  your blood sugar (glucose) after you have not eaten for a while (fasting). Pelvic exam and Pap test. Hepatitis B test. Hepatitis C test. HIV (human immunodeficiency virus) test. STI (sexually transmitted infection) testing, if you are at risk. Lung cancer screening. Colorectal cancer screening. Mammogram. Talk with your health care provider about when you should start having regular mammograms. This may depend on whether you have a family history of breast cancer. BRCA-related cancer screening. This may be done if you have a family history of breast, ovarian, tubal, or peritoneal cancers. Bone density scan. This is done to screen for osteoporosis. Talk with your health care provider about your test results, treatment options, and if necessary, the need for more tests. Follow these instructions at home: Eating and drinking  Eat a diet that includes fresh fruits and vegetables, whole grains, lean protein, and low-fat dairy products. Take vitamin and mineral supplements as recommended by your health care provider. Do not drink alcohol if: Your health care provider tells you not to drink. You are pregnant, may be pregnant, or are planning to become pregnant. If you drink alcohol: Limit how much you have to 0-1 drink a day. Know how much alcohol is in your drink. In the [place] one drink equals one 12 oz bottle of beer (355 mL), one 5 oz glass of wine (148 mL), or one 1 oz glass of hard liquor (44 mL). Lifestyle Brush your teeth every morning and night with fluoride toothpaste. Floss one time each day. Exercise for at least 30 minutes 5 or more days each week. Do not use any products that contain nicotine or tobacco. These products include cigarettes, chewing tobacco, and vaping devices, such as e-cigarettes. If you need   help quitting, ask your health care provider. Do not use drugs. If you are sexually active, practice safe sex. Use a condom or other form of protection to prevent STIs. If you  do not wish to become pregnant, use a form of birth control. If you plan to become pregnant, see your health care provider for a prepregnancy visit. Take aspirin only as told by your health care provider. Make sure that you understand how much to take and what form to take. Work with your health care provider to find out whether it is safe and beneficial for you to take aspirin daily. Find healthy ways to manage stress, such as: Meditation, yoga, or listening to music. Journaling. Talking to a trusted person. Spending time with friends and family. Minimize exposure to UV radiation to reduce your risk of skin cancer. Safety Always wear your seat belt while driving or riding in a vehicle. Do not drive: If you have been drinking alcohol. Do not ride with someone who has been drinking. When you are tired or distracted. While texting. If you have been using any mind-altering substances or drugs. Wear a helmet and other protective equipment during sports activities. If you have firearms in your house, make sure you follow all gun safety procedures. Seek help if you have been physically or sexually abused. What's next? Visit your health care provider once a year for an annual wellness visit. Ask your health care provider how often you should have your eyes and teeth checked. Stay up to date on all vaccines. This information is not intended to replace advice given to you by your health care provider. Make sure you discuss any questions you have with your health care provider. Document Revised: 03/13/2021 Document Reviewed: 03/13/2021 Elsevier Patient Education  2023 Elsevier Inc.  

## 2022-01-14 NOTE — Assessment & Plan Note (Signed)
Immunizations UTD.  ? ?Pap smear UTD.  ?Mammogram UTD. ?Colonoscopy UTD, due 2025.  ? ?Discussed the importance of a healthy diet and regular exercise in order for weight loss, and to reduce the risk of further co-morbidity. ? ?Exam today stable. ?Labs reviewed and pending. ?

## 2022-01-14 NOTE — Assessment & Plan Note (Signed)
Following with hematology through [facility], office notes reviewed through [facility]. Labs reviewed from February 2023 through [place]. ? ?Continue to monitor.  ? ? ?

## 2022-01-14 NOTE — Assessment & Plan Note (Signed)
Stable. ? ?Following with hematology for iron evaluation.  ?Labs reviewed from February 2023. ?

## 2022-01-14 NOTE — Assessment & Plan Note (Signed)
No longer on statin therapy due to myalgias. ?Consider 3-4 days weekly dosing.  ? ?Repeat lipid panel pending.  ?

## 2022-01-15 ENCOUNTER — Other Ambulatory Visit: Payer: Self-pay | Admitting: Primary Care

## 2022-01-15 DIAGNOSIS — R7303 Prediabetes: Secondary | ICD-10-CM

## 2022-01-15 DIAGNOSIS — E1169 Type 2 diabetes mellitus with other specified complication: Secondary | ICD-10-CM

## 2022-01-16 ENCOUNTER — Other Ambulatory Visit: Payer: Self-pay | Admitting: Primary Care

## 2022-01-16 DIAGNOSIS — E1165 Type 2 diabetes mellitus with hyperglycemia: Secondary | ICD-10-CM

## 2022-01-16 DIAGNOSIS — E785 Hyperlipidemia, unspecified: Secondary | ICD-10-CM

## 2022-01-16 MED ORDER — ATORVASTATIN CALCIUM 10 MG PO TABS: ORAL_TABLET | ORAL | 3 refills | Status: DC

## 2022-01-16 MED ORDER — METFORMIN HCL ER 500 MG PO TB24: 500.0000 mg | ORAL_TABLET | Freq: Every day | ORAL | 1 refills | Status: DC

## 2022-01-24 ENCOUNTER — Encounter: Payer: No Typology Code available for payment source | Admitting: *Deleted

## 2022-01-24 ENCOUNTER — Encounter: Payer: Self-pay | Admitting: Dietician

## 2022-01-24 ENCOUNTER — Encounter: Payer: No Typology Code available for payment source | Attending: Primary Care | Admitting: Dietician

## 2022-01-24 VITALS — Ht 64.0 in | Wt 161.5 lb

## 2022-01-24 DIAGNOSIS — Z713 Dietary counseling and surveillance: Secondary | ICD-10-CM | POA: Diagnosis not present

## 2022-01-24 DIAGNOSIS — E119 Type 2 diabetes mellitus without complications: Secondary | ICD-10-CM | POA: Diagnosis not present

## 2022-01-24 DIAGNOSIS — K900 Celiac disease: Secondary | ICD-10-CM

## 2022-01-24 NOTE — Progress Notes (Signed)
Medical Nutrition Therapy: Visit start time: 0830  end time: 0940  ?Assessment:  Diagnosis: Type 2 diabetes ?Past medical history: celiac disease ?Psychosocial issues/ stress concerns: high stress level, work related ? ?Preferred learning method:  ?Auditory ?Visual ?Hands-on ? ? ?Current weight: 161.5lbs Height: 5'4"  BMI: 27.72 ? ?Medications, supplements: reconciled list in medical record ? ?Progress and evaluation:  ?Reports some recent stress eating due to work related stress, including some sweets. ?Has had celiac disease for multiple years  She reports history of significant GI damage, and has been following strict gluten free diet since diagnosis. ?She is not testing BGs at home at this time. She has started taking Metformin daily.  ?She voices confusion about what she can eat in terms of carbohydrates; she has researched online to find conflicting information. She has been strictly limiting her carb intake due to not knowing what is allowable.  ?She would like to lose some weight, about 25lbs. Weight has decreased about 1.5lbs since making diet changes. ? ?Physical activity: treadmill, rowing, weights 45-75 minutes 4-5 times a week ? ?Dietary Intake:  ?Usual eating pattern includes 3 meals and 1-2 snacks per day. ?Dining out frequency: 1 meals per week. ? ?Breakfast:  steel cut oats with few small pieces dark choc bits; egg + egg white + 1 slice gf toast (c[employer] brand) with sugar free jelly; 4/28--2Good yogurt; ?Snack: none  ?Lunch: 8/84-- 3 slices gf Schar crispbread with lowfat mayo, ham, 1 1/2 thin slices swiss; tuna/ chicken with olive oil lime juice, Mrs Deliah Boston grilled ?Snack: skinny pop popcorn; pistachios; apple with peanut butter ?Supper: spinach, green pepper, carrots salad with skinny girl or sf dressing; 4/27 tuna with light miracle whip on green bell pepper, small portion brown rice with quinoa; zuccini with tomato sauce and mozzarella; spaghetti squash; [employer] brand pizza  crust (gluten free) ?Snack: none or same as pm ?Beverages: coffee with fat free half & half and sugar free creamer, sugar free soda, unsweetened tea, water ? ?Intervention:  ? ?Nutrition Care Education: ?  ?Basic nutrition: basic food groups; appropriate nutrient balance; appropriate meal and snack schedule; general nutrition guidelines    ?Weight control: determining reasonable weight loss rate, importance of low sugar and low fat choices, appropriate food portions, estimated energy needs for gradual weight loss at 1400 kcal, provided guidance for 45% CHO, 25% pro, 30% fat ?Advanced nutrition:  food label reading for carbohydrates ?Diabetes:  appropriate meal and snack schedule, appropriate carb intake and balance, healthy carb choices, role of fiber, protein, fat ?Other: gluten free food choices ? ? ?Nutritional Diagnosis:  [place]-2.1 Inpaired nutrition utilization and [place]-2.2 Altered nutrition-related laboratory As related to celiac disease, type 2 diabetes.  As evidenced by past diagnosis of celiac disease and patient report of intestinal damage and GI distress with gluten intake; elevated HbA1C. ? ? ?Education Materials given:  ?General diet guidelines for Diabetes ?Plate Planner with food lists, sample meal pattern ?Visit summary with goals/ instructions ? ? ?Learner/ who was taught:  ?Patient  ? ? ?Level of understanding: ?Verbalizes/ demonstrates competency ? ?Demonstrated degree of understanding via:   Teach back ?Learning barriers: ?None ? ? ?Willingness to learn/ readiness for change: ?Eager, change in progress ? ? ?Monitoring and Evaluation:  Dietary intake, exercise, BG control, and body weight ?     follow up:  03/31/22 at 9:45am  ? ?

## 2022-01-24 NOTE — Patient Instructions (Signed)
Great job making healthy food choices! Keep it up! ?Include average of 30g carb with each meal, up to 45g. Check food labels for total carb to help determine carb content of gluten free products. ?Continue with regular exercise.  ?

## 2022-03-24 ENCOUNTER — Ambulatory Visit: Payer: No Typology Code available for payment source | Admitting: Dietician

## 2022-03-31 ENCOUNTER — Encounter: Payer: No Typology Code available for payment source | Attending: Primary Care | Admitting: Dietician

## 2022-03-31 ENCOUNTER — Encounter: Payer: Self-pay | Admitting: Dietician

## 2022-03-31 VITALS — Ht 64.0 in | Wt 155.4 lb

## 2022-03-31 DIAGNOSIS — E119 Type 2 diabetes mellitus without complications: Secondary | ICD-10-CM | POA: Insufficient documentation

## 2022-03-31 DIAGNOSIS — Z713 Dietary counseling and surveillance: Secondary | ICD-10-CM | POA: Diagnosis not present

## 2022-03-31 DIAGNOSIS — K900 Celiac disease: Secondary | ICD-10-CM | POA: Insufficient documentation

## 2022-03-31 NOTE — Progress Notes (Signed)
Medical Nutrition Therapy: Visit start time: 0945  end time: 1015  Assessment:  Diagnosis: Type 2 Diabetes Medical history changes: no changes Psychosocial issues/ stress concerns: none  Medications, supplement changes: no changes   Current weight: 155.4lbs Height: 5'4" BMI: 26.67  Progress and evaluation:  Weight loss of about 6lbs since previous visit on 01/24/22 Patient feels her exercise and attention to eating (less stress eating) have contributed to weight loss. Hunger has decreased since taking Metformin, sometimes has early satiety when eating    Dietary Intake:  Usual eating pattern includes 3 meals and 1-2 snacks per day. Dining out frequency: 1 meals per week.  Breakfast: steel cut oats with few small pieces dark choc bits; egg + egg white + 1 slice gf toast ([employer] brand) with sugar free jelly; yogurt Snack: none Lunch: 1/2 potato with chicken, cheese, sm portion bbq sauce;  Snack: carrots often with hummus Supper: 7/2 -- salmon with small ear corn (not very hungry); beef 2x in past 1-2 months Snack: none Beverages: water, coffee with fat free, low sugar creamers, unsweetened tea, occ diet soda  Physical activity: treadmill, rowing, weights 45-75 minutes, 4-5 times a week  Intervention:   Nutrition Care Education:  Basic nutrition: reviewed appropriate nutrient balance; appropriate meal and snack schedule; general nutrition guidelines    Weight control: reviewed progress since previous visit Diabetes:  reviewed appropriate carb intake and balance; healthy carb choices; effects of physical activity  Other Intervention Notes: Patient continues to follow healthy eating pattern and is limiting carbohydrate intake with meals.  Decreased appetite/ hunger, along with reduction in emotional eating, has led to smaller food portions and some weight loss.  No MNT follow up needed at this time; patient to schedule as needed.  Nutritional Diagnosis:  [place]-2.1 Impaired  nutrition utilization and [place]-2.2 Altered nutrition-related laboratory As related to celiac disease, type 2 diabetes.  As evidenced by past diagnosis of celiac disease and patient report of intestinal damage and GI distress with gluten intake; elevated HbA1C.   Education Materials given:  Visit summary with goals/ instructions   Learner/ who was taught:  Patient    Level of understanding: Verbalizes/ demonstrates competency   Demonstrated degree of understanding via:   Teach back Learning barriers: None  Willingness to learn/ readiness for change: Eager, change in progress   Monitoring and Evaluation:  Dietary intake, exercise, BG control, and body weight      follow up: prn

## 2022-03-31 NOTE — Patient Instructions (Signed)
Continue with healthy eating choices and habits, great job controlling carb intake! Keep up the awesome exercise!

## 2022-04-22 ENCOUNTER — Ambulatory Visit: Payer: No Typology Code available for payment source | Admitting: Primary Care

## 2022-04-22 ENCOUNTER — Encounter: Payer: Self-pay | Admitting: Primary Care

## 2022-04-22 VITALS — BP 122/82 | HR 96 | Temp 98.0°F | Ht 64.0 in | Wt 153.0 lb

## 2022-04-22 DIAGNOSIS — E1165 Type 2 diabetes mellitus with hyperglycemia: Secondary | ICD-10-CM | POA: Diagnosis not present

## 2022-04-22 LAB — POCT GLYCOSYLATED HEMOGLOBIN (HGB A1C): Hemoglobin A1C: 5.9 % — AB (ref 4.0–5.6)

## 2022-04-22 NOTE — Assessment & Plan Note (Signed)
Controlled with A1C of 5.9!  Commended her on improvements in her diet.  Commended her on regular exercise!  Continue metformin XR 500 mg daily. Foot exam today. Managed on statin. Urine microalbumin due, she declines today.  Pneumonia vaccine UTD.  Follow up in 6 months.

## 2022-04-22 NOTE — Progress Notes (Signed)
Subjective:    Patient ID: Paula Gray, female    DOB: [birth year], 66 y.o.   MRN: [id number]  Diabetes Pertinent negatives for hypoglycemia include no dizziness. Pertinent negatives for diabetes include no chest pain.    Paula Gray is a very pleasant 66 y.o. female with a history of celiac disease, hyperlipidemia, type 2 diabetes who presents today for follow up of newly diagnosed diabetes.  Current medications include: metformin XR 500 mg daily  She is checking her blood glucose 0 times daily.  Last A1C: 7.2 in April 2023, 5.9 today Last Eye Exam: UTD  Last Foot Exam: Due today Pneumonia Vaccination: 2021 Urine Microalbumin: Due.  Statin: atorvastatin   Dietary changes since last visit: She has been meeting with a nutritionist, has made some dietary changes. Is no longer stress eating.   Exercise: Active. Exercises 3-4 times weekly.   BP Readings from Last 3 Encounters:  04/22/22 122/82  01/14/22 134/76  11/16/20 116/78    Wt Readings from Last 3 Encounters:  04/22/22 153 lb (69.4 kg)  03/31/22 155 lb 6.4 oz (70.5 kg)  01/24/22 161 lb 8 oz (73.3 kg)      Review of Systems  Respiratory:  Negative for shortness of breath.   Cardiovascular:  Negative for chest pain.  Neurological:  Negative for dizziness.         Past Medical History:  Diagnosis Date   Arthritis    Celiac disease    Chickenpox    Hyperlipidemia    Migraine     Social History   Socioeconomic History   Marital status: Divorced    Spouse name: Not on file   Number of children: Not on file   Years of education: Not on file   Highest education level: Not on file  Occupational History   Not on file  Tobacco Use   Smoking status: Never   Smokeless tobacco: Never  Vaping Use   Vaping Use: Never used  Substance and Sexual Activity   Alcohol use: Yes    Alcohol/week: 1.0 standard drink of alcohol    Types: 1 Standard drinks or equivalent per week    Comment: social   Drug  use: No   Sexual activity: Not on file  Other Topics Concern   Not on file  Social History Narrative   Single.    2 children. 1 grandchild.   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    Social Determinants of Health   Financial Resource Strain: Not on file  Food Insecurity: Not on file  Transportation Needs: Not on file  Physical Activity: Not on file  Stress: Not on file  Social Connections: Not on file  Intimate Partner Violence: Not on file    Past Surgical History:  Procedure Laterality Date   CARPAL TUNNEL RELEASE  2006   [place]   COLONOSCOPY WITH PROPOFOL N/A 06/24/2019   Procedure: COLONOSCOPY WITH PROPOFOL;  Surgeon: Jonathon Bellows, MD;  Location: [facility] ENDOSCOPY;  Service: Gastroenterology;  Laterality: N/A;   RETINAL TEAR REPAIR CRYOTHERAPY Left 2015   TONSILLECTOMY  1962    Family History  Problem Relation Age of Onset   Arthritis Maternal Grandmother    Heart disease Maternal Grandfather    Arthritis Paternal Grandmother    Colon cancer Paternal Grandfather    Skin cancer Father    Breast cancer Other     Allergies  Allergen Reactions   Iron  Dextran Palpitations   Nsaids     Products that contain gluten   Prednisone Other (See Comments)    History of celiac disease    Current Outpatient Medications on File Prior to Visit  Medication Sig Dispense Refill   atorvastatin (LIPITOR) 10 MG tablet Take 1 tablet by mouth three times weekly for cholesterol. 36 tablet 3   Cholecalciferol (VITAMIN D3) 1000 units CAPS Take by mouth. With vitamin K     metFORMIN (GLUCOPHAGE-XR) 500 MG 24 hr tablet Take 1 tablet (500 mg total) by mouth daily with breakfast. for diabetes. 90 tablet 1   No current facility-administered medications on file prior to visit.    BP 122/82   Pulse 96   Temp 98 F (36.7 C) (Oral)   Ht '5\' 4"'$  (1.626 m)   Wt 153 lb (69.4 kg)   SpO2 96%   BMI 26.26 kg/m  Objective:   Physical  Exam Cardiovascular:     Rate and Rhythm: Normal rate and regular rhythm.  Pulmonary:     Effort: Pulmonary effort is normal.     Breath sounds: Normal breath sounds.  Musculoskeletal:     Cervical back: Neck supple.  Skin:    General: Skin is warm and dry.           Assessment & Plan:   Problem List Items Addressed This Visit       Endocrine   Type 2 diabetes mellitus with hyperglycemia ([place]) - Primary    Controlled with A1C of 5.9!  Commended her on improvements in her diet.  Commended her on regular exercise!  Continue metformin XR 500 mg daily. Foot exam today. Managed on statin. Urine microalbumin due, she declines today.  Pneumonia vaccine UTD.  Follow up in 6 months.      Relevant Orders   POCT glycosylated hemoglobin (Hb A1C) (Completed)       Pleas Koch, NP

## 2022-04-22 NOTE — Patient Instructions (Signed)
Continue metformin daily for diabetes.  Keep up the great work!  Please schedule a follow up visit for 6 months for a diabetes check/follow up.  It was a pleasure to see you today!

## 2022-05-13 ENCOUNTER — Encounter: Payer: Self-pay | Admitting: Primary Care

## 2022-07-08 ENCOUNTER — Other Ambulatory Visit: Payer: Self-pay | Admitting: Primary Care

## 2022-07-08 DIAGNOSIS — E1165 Type 2 diabetes mellitus with hyperglycemia: Secondary | ICD-10-CM

## 2022-08-07 ENCOUNTER — Telehealth: Payer: Self-pay | Admitting: Orthopedic Surgery

## 2022-08-07 ENCOUNTER — Ambulatory Visit
Admission: RE | Admit: 2022-08-07 | Discharge: 2022-08-07 | Disposition: A | Discharge status: Home / Self Care | Payer: No Typology Code available for payment source | Attending: Orthopedic Surgery | Admitting: Orthopedic Surgery

## 2022-08-07 ENCOUNTER — Encounter: Payer: Self-pay | Admitting: Oncology

## 2022-08-07 ENCOUNTER — Ambulatory Visit
Admission: RE | Admit: 2022-08-07 | Discharge: 2022-08-07 | Disposition: A | Discharge status: Home / Self Care | Payer: No Typology Code available for payment source | Source: Ambulatory Visit | Attending: Orthopedic Surgery | Admitting: Orthopedic Surgery

## 2022-08-07 DIAGNOSIS — M545 Low back pain, unspecified: Secondary | ICD-10-CM | POA: Insufficient documentation

## 2022-08-07 NOTE — Telephone Encounter (Signed)
Disregard

## 2022-08-07 NOTE — Telephone Encounter (Signed)
Patient said that she will try to get xrays before the appt. This is late notice so she is not sure if she can make it. I told her if she can not get them before she can get them after.

## 2022-08-07 NOTE — Progress Notes (Signed)
Referring Physician:  Pleas Koch, NP [place],  [place] [address]  Primary Physician:  Pleas Koch, NP  History of Present Illness: 08/08/2022 [name] has a history of DM, migraines, and hyperlipidemia.  Saw [facility] back in 2021 for right back and leg pain. Was given medrol dose pack, robaxin, and HEP.   She has intermittent right sided LBP into lateral right leg to her foot x 6 months. She's had intermittent flare ups of this pain x years. Pain goes away when she sits down. Pain is worse with standing and walking. She has intermittent numbness/tingling in her right foot. No weakness. No left sided pain.   She feels like her pain has improved over the last week or so.   Allergy noted to NSAIDs and prednisone- cannot take anything that has gluten in it.   Conservative measures:  Physical therapy: no  Multimodal medical therapy including regular antiinflammatories: ibuprofen, tylenol  Injections: no epidural steroid injections  Past Surgery: no  Paula Gray has no symptoms of cervical myelopathy.  The symptoms are causing a significant impact on the patient's life.   Review of Systems:  A 10 point review of systems is negative, except for the pertinent positives and negatives detailed in the HPI.  Past Medical History: Past Medical History:  Diagnosis Date   Arthritis    Celiac disease    Chickenpox    Hyperlipidemia    Migraine     Past Surgical History: Past Surgical History:  Procedure Laterality Date   CARPAL TUNNEL RELEASE  2006   [place]   COLONOSCOPY WITH PROPOFOL N/A 06/24/2019   Procedure: COLONOSCOPY WITH PROPOFOL;  Surgeon: Jonathon Bellows, MD;  Location: [facility] ENDOSCOPY;  Service: Gastroenterology;  Laterality: N/A;   RETINAL TEAR REPAIR CRYOTHERAPY Left 2015   TONSILLECTOMY  1962    Allergies: Allergies as of 08/08/2022 - Review Complete 08/08/2022  Allergen Reaction Noted   Iron dextran  Palpitations 02/14/2015   Nsaids  06/24/2019   Prednisone Other (See Comments) 05/07/2016    Medications: Outpatient Encounter Medications as of 08/08/2022  Medication Sig   Cholecalciferol (VITAMIN D3) 1000 units CAPS Take by mouth. With vitamin K   metFORMIN (GLUCOPHAGE-XR) 500 MG 24 hr tablet TAKE 1 TABLET (500 MG TOTAL) BY MOUTH DAILY WITH BREAKFAST. FOR DIABETES.   atorvastatin (LIPITOR) 10 MG tablet Take 1 tablet by mouth three times weekly for cholesterol. (Patient not taking: Reported on 08/08/2022)   No facility-administered encounter medications on file as of 08/08/2022.    Social History: Social History   Tobacco Use   Smoking status: Never   Smokeless tobacco: Never  Vaping Use   Vaping Use: Never used  Substance Use Topics   Alcohol use: Yes    Alcohol/week: 1.0 standard drink of alcohol    Types: 1 Standard drinks or equivalent per week    Comment: social   Drug use: No    Family Medical History: Family History  Problem Relation Age of Onset   Arthritis Maternal Grandmother    Heart disease Maternal Grandfather    Arthritis Paternal Grandmother    Colon cancer Paternal Grandfather    Skin cancer Father    Breast cancer Other     Physical Examination: Vitals:   08/08/22 1422  BP: 116/78    General: Patient is well developed, well nourished, calm, collected, and in no apparent distress. Attention to examination is appropriate.  Respiratory: Patient is  breathing without any difficulty.   NEUROLOGICAL:     Awake, alert, oriented to person, place, and time.  Speech is clear and fluent. Fund of knowledge is appropriate.   Cranial Nerves: Pupils equal round and reactive to light.  Facial tone is symmetric.  Facial sensation is symmetric.  ROM of spine:  reasonable ROM of lumbar spine with no pain  No abnormal lesions on exposed skin.   Strength: Side Biceps Triceps Deltoid Interossei Grip Wrist Ext. Wrist Flex.  R '5 5 5 5 5 5 5  '$ L '5 5 5 5 5 5  5   '$ Side Iliopsoas Quads Hamstring PF DF EHL  R '5 5 5 5 5 5  '$ L '5 5 5 5 5 5   '$ Reflexes are 2+ and symmetric at the biceps, triceps, brachioradialis, patella and achilles.   Hoffman's is absent.  Clonus is not present.   Bilateral upper and lower extremity sensation is intact to light touch.     Mild right sided lower lumbar tenderness.   Gait is normal.    Medical Decision Making  Imaging: Lumbar xrays dated 08/07/22:  Slip L4-L5 with mild lumbar DDD/spondylosis L4-S1  Radiology report is not available yet.   Assessment and Plan: Paula Gray is a pleasant 66 y.o. female with  intermittent right sided LBP into lateral right leg to her foot x 6 months. She's had intermittent flare ups of this pain x years. Pain goes away when she sits down.   She feels like her pain has improved over the last week or so.  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 She was given their number.  - Medications limited due to gluten allergy. Cannot tolerate many NSAIDs. Okay to continue on prn ibuprofen or OTC back and body.  - Will hold on lumbar MRI as her pain is improving. Can consider this if pain gets worse.  - Of note, she will be likely getting laid off in next few months so will have to see what happens with her insurance.  - She will f/u with me for a phone visit in 6 weeks.   I spent a total of 30 minutes in face-to-face and non-face-to-face activities related to this patient's care toda including review of outside records, review of imaging, review of symptoms, physical exam, discussion of differential diagnosis, discussion of treatment options, and documentation.   Thank you for involving me in the care of this patient.   Geronimo Boot PA-C Dept. of Neurosurgery

## 2022-08-07 NOTE — Telephone Encounter (Signed)
She has appt tomorrow. Please have her come early for lumbar xrays. I ordered them. Thanks!

## 2022-08-08 ENCOUNTER — Encounter: Payer: Self-pay | Admitting: Orthopedic Surgery

## 2022-08-08 ENCOUNTER — Ambulatory Visit (INDEPENDENT_AMBULATORY_CARE_PROVIDER_SITE_OTHER): Payer: No Typology Code available for payment source | Admitting: Orthopedic Surgery

## 2022-08-08 VITALS — BP 116/78 | Ht 64.0 in | Wt 154.8 lb

## 2022-08-08 DIAGNOSIS — M4316 Spondylolisthesis, lumbar region: Secondary | ICD-10-CM | POA: Diagnosis not present

## 2022-08-08 DIAGNOSIS — M5416 Radiculopathy, lumbar region: Secondary | ICD-10-CM | POA: Diagnosis not present

## 2022-08-08 DIAGNOSIS — M5136 Other intervertebral disc degeneration, lumbar region: Secondary | ICD-10-CM

## 2022-08-08 NOTE — Patient Instructions (Signed)
It was so nice to see you today, I am sorry that you are hurting so much.   Your lower back xrays showed some wear and tear (arthritis) and I think this may be causing your pain.   I sent physical therapy orders to [facility], they should call you to schedule. They are located at [place], [place] [address]. Their number is [phone].   If pain gets worse, we can consider a lumbar MRI scan.   I will see you back in for a phone review in December. Please do not hesitate to call if you have any questions or concerns. You can also message me in [place].   If you have not heard back about any of the tests/procedures in the next week, please call the office so we can help you get these things scheduled.   Geronimo Boot PA-C [phone]

## 2022-09-17 NOTE — Progress Notes (Unsigned)
Telephone Visit- Progress Note: Referring Physician:  Clark, Katherine K, NP [address] [place],  [place] [address]  Primary Physician:  Clark, Katherine K, NP  This visit was performed via telephone.  Patient location: home Provider location: office  I spent a total of 10 minutes non-face-to-face activities for this visit on the date of this encounter including review of current clinical condition and response to treatment.    Patient has given verbal consent to this telephone visits and we reviewed the limitations of a telephone visit. Patient wishes to proceed.      Chief Complaint:  lumbar f/u   History of Present Illness: Ms. Amerah Hoying has a history of DM, migraines, and hyperlipidemia.  Last seen by me on 08/08/22 for LBP and right leg pain. She has known  slip L4-L5 with mild lumbar DDD/spondylosis L4-S1.   She felt like her pain was improving at her last visit and she was sent to PT. Medications limited due to gluten allergy. She was to continue prn motrin/OTC back and body.   Telephone visit scheduled for follow up.   She did not go to PT as she had to pay out of pocket until she met her deductible. Her insurance gave her information about an app with a physical therapist and she is using this. She continues with intermittent right sided LBP into lateral right leg to her foot. She feels like she is improving. She did get a massage earlier this week and it flared up her right leg pain. She has intermittent numbness/tingling in her right foot. No weakness. No left sided pain.   Allergy noted to NSAIDs and prednisone- cannot take anything that has gluten in it.   Conservative measures:  Physical therapy: unable do do for financial reasons. Doing PT app from her insurance company.  Multimodal medical therapy including regular antiinflammatories: ibuprofen, tylenol  Injections: no epidural steroid injections  Past Surgery: no   Exam: No exam done as this  was a telephone encounter.     Imaging: Nothing recent.   I have personally reviewed the images and agree with the above interpretation.  Assessment and Plan: Ms. Whipp is a pleasant 66 y.o. female with chronic intermittent right sided LBP into lateral right leg to her foot. Unable to do formal PT due to financial reasons. She feels like pain has continued to improve. Slight flare up of right leg pain after massage this week.  She has known slip L4-L5 with mild lumbar DDD/spondylosis L4-S1  Treatment options discussed with patient and following plan made:   - Doing free PT app from her insurance company. She will continue this.  - I will send her printed lumbar exercises.  - Medications limited due to gluten allergy. Cannot tolerate many NSAIDs. Okay to continue on prn ibuprofen or OTC back and body.  - Will hold on lumbar MRI as her pain is improving. Can consider this if pain gets worse.  - Of note, she will be likely getting laid off in next few months so will have to see what happens with her insurance.  - I will message her in 6 weeks to check on her progress.   Stacy Luna PA-C Dept. of Neurosurgery     

## 2022-09-18 ENCOUNTER — Encounter: Payer: Self-pay | Admitting: Orthopedic Surgery

## 2022-09-18 ENCOUNTER — Ambulatory Visit (INDEPENDENT_AMBULATORY_CARE_PROVIDER_SITE_OTHER): Payer: No Typology Code available for payment source | Admitting: Orthopedic Surgery

## 2022-09-18 DIAGNOSIS — M4316 Spondylolisthesis, lumbar region: Secondary | ICD-10-CM

## 2022-09-18 DIAGNOSIS — M5136 Other intervertebral disc degeneration, lumbar region: Secondary | ICD-10-CM

## 2022-09-18 DIAGNOSIS — M5416 Radiculopathy, lumbar region: Secondary | ICD-10-CM | POA: Diagnosis not present

## 2022-10-04 ENCOUNTER — Other Ambulatory Visit: Payer: Self-pay | Admitting: Primary Care

## 2022-10-04 DIAGNOSIS — E1165 Type 2 diabetes mellitus with hyperglycemia: Secondary | ICD-10-CM

## 2022-10-05 NOTE — Telephone Encounter (Signed)
Patient is due for follow up in April 2024. Please schedule.

## 2022-10-06 NOTE — Telephone Encounter (Signed)
Sent patient a MyChart message to contact us.

## 2022-10-06 NOTE — Telephone Encounter (Signed)
LVM for patient to call back and schedule

## 2022-10-20 ENCOUNTER — Other Ambulatory Visit: Payer: Self-pay

## 2022-10-20 DIAGNOSIS — Z1231 Encounter for screening mammogram for malignant neoplasm of breast: Secondary | ICD-10-CM

## 2022-11-19 ENCOUNTER — Ambulatory Visit
Admission: RE | Admit: 2022-11-19 | Discharge: 2022-11-19 | Disposition: A | Discharge status: Home / Self Care | Payer: No Typology Code available for payment source | Source: Ambulatory Visit

## 2022-11-19 DIAGNOSIS — Z1231 Encounter for screening mammogram for malignant neoplasm of breast: Secondary | ICD-10-CM

## 2023-01-12 LAB — HM DIABETES EYE EXAM

## 2023-01-20 LAB — COMPREHENSIVE METABOLIC PANEL: eGFR: 96

## 2023-10-21 ENCOUNTER — Other Ambulatory Visit: Payer: Self-pay

## 2023-10-21 DIAGNOSIS — Z1231 Encounter for screening mammogram for malignant neoplasm of breast: Secondary | ICD-10-CM

## 2023-10-29 ENCOUNTER — Encounter: Payer: Self-pay | Admitting: Internal Medicine

## 2023-10-29 ENCOUNTER — Ambulatory Visit: Payer: No Typology Code available for payment source | Admitting: Internal Medicine

## 2023-10-29 ENCOUNTER — Other Ambulatory Visit: Payer: Self-pay

## 2023-10-29 VITALS — BP 134/82 | HR 90 | Temp 97.7°F | Resp 16 | Ht 64.0 in | Wt 140.7 lb

## 2023-10-29 DIAGNOSIS — Z7984 Long term (current) use of oral hypoglycemic drugs: Secondary | ICD-10-CM

## 2023-10-29 DIAGNOSIS — E782 Mixed hyperlipidemia: Secondary | ICD-10-CM

## 2023-10-29 DIAGNOSIS — E1165 Type 2 diabetes mellitus with hyperglycemia: Secondary | ICD-10-CM

## 2023-10-29 DIAGNOSIS — K900 Celiac disease: Secondary | ICD-10-CM | POA: Diagnosis not present

## 2023-10-29 DIAGNOSIS — Z23 Encounter for immunization: Secondary | ICD-10-CM | POA: Diagnosis not present

## 2023-10-29 DIAGNOSIS — Z9109 Other allergy status, other than to drugs and biological substances: Secondary | ICD-10-CM

## 2023-10-29 LAB — POCT GLYCOSYLATED HEMOGLOBIN (HGB A1C): Hemoglobin A1C: 6.1 % — AB (ref 4.0–5.6)

## 2023-10-29 MED ORDER — METFORMIN HCL ER 500 MG PO TB24: 500.0000 mg | ORAL_TABLET | Freq: Every day | ORAL | 1 refills | Status: DC

## 2023-10-29 MED ORDER — ROSUVASTATIN CALCIUM 10 MG PO TABS: ORAL_TABLET | ORAL | 1 refills | Status: DC

## 2023-10-29 NOTE — Progress Notes (Signed)
New Patient Office Visit  Subjective    Patient ID: Paula Gray, female    DOB: 1956-07-31  Age: 68 y.o. MRN: [id number]  CC:  Chief Complaint  Patient presents with   Establish Care    HPI LAPARIS DURRETT presents to establish care.  Diabetes, Type 2: -Last A1c 6.1% 4/24 -Medications: Metformin 500 mg XR once daily -Patient is compliant with the above medications and reports no side effects.  -Checking BG at home: 90-125 -Exercise: 4 times a week - gym, rowing machine, weights and treadmill/cycling  -Eye exam:  Eye - UTD -Foot exam: Due -Microalbumin: Due -Statin: Yes -PNA vaccine: Due today -Denies symptoms of hypoglycemia, polyuria, polydipsia, numbness extremities, foot ulcers/trauma.   HLD: -Medications: Lipitor 10 mg 3x weekly due to leg cramps -Patient is compliant with above medications and reports no side effects.  -Last lipid panel: 4/24 TC 146, triglycerides 116, HDL 44, LDL 79  Celiac Disease: -Diagnosed about 5 years ago with endoscopy and labs -Now completely gluten free, will have severe nausea and vomiting with gluten -History of iron deficiency anemia as a consequence of celiac - had to have several iron infusions in the past -Hemoglobin, iron panel and ferritin normal in 4/24  Environmental Allergies: -Currently taking an over the counter anti-histamine, has allergies usually in the spring  Health Maintenance: -Blood work UTD -Mammogram scheduled -Colon cancer screening: colonoscopy 05/2019, repeat in 5 years   Outpatient Encounter Medications as of 10/29/2023  Medication Sig   Cholecalciferol (VITAMIN D3) 1000 units CAPS Take by mouth. With vitamin K   rosuvastatin (CRESTOR) 10 MG tablet Take 10 mg 3 times weekly.   [DISCONTINUED] atorvastatin (LIPITOR) 10 MG tablet Take 10 mg by mouth daily.   [DISCONTINUED] metFORMIN (GLUCOPHAGE-XR) 500 MG 24 hr tablet TAKE 1 TABLET (500 MG TOTAL) BY MOUTH DAILY WITH BREAKFAST. FOR DIABETES.    metFORMIN (GLUCOPHAGE-XR) 500 MG 24 hr tablet Take 1 tablet (500 mg total) by mouth daily with breakfast. for diabetes.   [DISCONTINUED] atorvastatin (LIPITOR) 10 MG tablet Take 1 tablet by mouth three times weekly for cholesterol. (Patient not taking: Reported on 08/08/2022)   No facility-administered encounter medications on file as of 10/29/2023.    Past Medical History:  Diagnosis Date   Arthritis    Celiac disease    Chickenpox    Hyperlipidemia    Migraine     Past Surgical History:  Procedure Laterality Date   CARPAL TUNNEL RELEASE  2006   CESAREAN SECTION  1985   COLONOSCOPY WITH PROPOFOL N/A 06/24/2019   Procedure: COLONOSCOPY WITH PROPOFOL;  Surgeon: Wyline Mood, MD;  Location: [facility] ENDOSCOPY;  Service: Gastroenterology;  Laterality: N/A;   RETINAL TEAR REPAIR CRYOTHERAPY Left 2015   TONSILLECTOMY  1962    Family History  Problem Relation Age of Onset   Arthritis Maternal Grandmother    Heart disease Maternal Grandfather    Arthritis Paternal Grandmother    Colon cancer Paternal Grandfather    Skin cancer Father    Breast cancer Other     Social History   Socioeconomic History   Marital status: Divorced    Spouse name: Not on file   Number of children: Not on file   Years of education: 2   Highest education level: Not on file  Occupational History   Not on file  Tobacco Use   Smoking status: Never    Passive exposure: Never   Smokeless tobacco: Never  Vaping Use   Vaping status:  Never Used  Substance and Sexual Activity   Alcohol use: Yes    Alcohol/week: 1.0 standard drink of alcohol    Types: 1 Standard drinks or equivalent per week    Comment: social   Drug use: No   Sexual activity: Not on file  Other Topics Concern   Not on file  Social History Narrative   Single.    2 children. 1 grandchild.   Works as a Tree surgeon.   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    Social Drivers of Corporate investment banker Strain: Low Risk   (01/20/2023)   Received from [facility]   Overall Financial Resource Strain (CARDIA)    Difficulty of Paying Living Expenses: Not hard at all  Food Insecurity: No Food Insecurity (01/20/2023)   Received from [facility]   Hunger Vital Sign    Worried About Running Out of Food in the Last Year: Never true    Ran Out of Food in the Last Year: Never true  Transportation Needs: No Transportation Needs (01/20/2023)   Received from [facility] - Transportation    In the past 12 months, has lack of transportation kept you from medical appointments or from getting medications?: No    Lack of Transportation (Non-Medical): No  Physical Activity: Insufficiently Active (08/10/2023)   Received from [facility]   Exercise Vital Sign    Days of Exercise per Week: 3 days    Minutes of Exercise per Session: 30 min  Stress: Not on file  Social Connections: Not on file  Intimate Partner Violence: Not on file    Review of Systems  All other systems reviewed and are negative.       Objective    BP 134/82 (Cuff Size: Large)   Pulse 90   Temp 97.7 F (36.5 C) (Oral)   Resp 16   Ht 5\' 4"  (1.626 m)   Wt 140 lb 11.2 oz (63.8 kg)   SpO2 98%   BMI 24.15 kg/m   Physical Exam Constitutional:      Appearance: Normal appearance.  HENT:     Head: Normocephalic and atraumatic.     Mouth/Throat:     Mouth: Mucous membranes are moist.     Pharynx: Oropharynx is clear.  Eyes:     Extraocular Movements: Extraocular movements intact.     Conjunctiva/sclera: Conjunctivae normal.     Pupils: Pupils are equal, round, and reactive to light.  Neck:     Comments: No thyromegaly Cardiovascular:     Rate and Rhythm: Normal rate and regular rhythm.     Pulses:          Dorsalis pedis pulses are 2+ on the right side and 2+ on the left side.  Pulmonary:     Effort: Pulmonary effort is normal.     Breath sounds: Normal  breath sounds.  Musculoskeletal:     Cervical back: No tenderness.     Right lower leg: No edema.     Left lower leg: No edema.     Right foot: Normal range of motion. No deformity, bunion, Charcot foot, foot drop or prominent metatarsal heads.     Left foot: Normal range of motion. No deformity, bunion, Charcot foot, foot drop or prominent metatarsal heads.  Feet:     Right foot:     Protective Sensation: 6 sites tested.  6 sites sensed.     Skin  integrity: Skin integrity normal.     Toenail Condition: Right toenails are normal.     Left foot:     Protective Sensation: 6 sites tested.  6 sites sensed.     Skin integrity: Skin integrity normal.     Toenail Condition: Left toenails are normal.  Lymphadenopathy:     Cervical: No cervical adenopathy.  Skin:    General: Skin is warm and dry.  Neurological:     General: No focal deficit present.     Mental Status: She is alert. Mental status is at baseline.  Psychiatric:        Mood and Affect: Mood normal.        Behavior: Behavior normal.         Assessment & Plan:  Type 2 diabetes mellitus with hyperglycemia, without long-term current use of insulin (HCC) Assessment & Plan: Controlled, A1c today 6.1%. Continue Metformin 500 mg XR once daily. Patient has goal to be off medication in the future. Unable to obtain microalbumin today but foot exam performed.   Orders: -     [facility] Diabetes Foot Exam -     POCT glycosylated hemoglobin (Hb A1C) -     metFORMIN HCl ER; Take 1 tablet (500 mg total) by mouth daily with breakfast. for diabetes.  Dispense: 90 tablet; Refill: 1  Celiac disease Assessment & Plan: Symptoms stable on gluten free diet, plan to recheck CBC at follow up.   Mixed hyperlipidemia Assessment & Plan: Cholesterol well controlled, plan to recheck at follow up. Patient interested in trying Crestor, as she can only take Lipitor 3x weekly due to leg cramping. Will switch to Crestor 10 mg 3x weekly and recheck at follow  up.  Orders: -     Rosuvastatin Calcium; Take 10 mg 3 times weekly.  Dispense: 36 tablet; Refill: 1  Environmental allergies Assessment & Plan: Discussed oral anti-histamines, will restart one now prior to spring season. Also discussed Astelin nasal spray and Zadiator eye drops as needed.   Vaccine for streptococcus pneumoniae and influenza -     Pneumococcal conjugate vaccine 20-valent    Return in about 3 months (around 01/27/2024).   Margarita Mail, DO

## 2023-10-29 NOTE — Assessment & Plan Note (Signed)
Discussed oral anti-histamines, will restart one now prior to spring season. Also discussed Astelin nasal spray and Zadiator eye drops as needed.

## 2023-10-29 NOTE — Assessment & Plan Note (Signed)
Cholesterol well controlled, plan to recheck at follow up. Patient interested in trying Crestor, as she can only take Lipitor 3x weekly due to leg cramping. Will switch to Crestor 10 mg 3x weekly and recheck at follow up.

## 2023-10-29 NOTE — Assessment & Plan Note (Signed)
Symptoms stable on gluten free diet, plan to recheck CBC at follow up.

## 2023-10-29 NOTE — Assessment & Plan Note (Signed)
Controlled, A1c today 6.1%. Continue Metformin 500 mg XR once daily. Patient has goal to be off medication in the future. Unable to obtain microalbumin today but foot exam performed.

## 2023-10-31 ENCOUNTER — Encounter: Payer: Self-pay | Admitting: Internal Medicine

## 2023-11-03 ENCOUNTER — Other Ambulatory Visit: Payer: Self-pay

## 2023-11-03 ENCOUNTER — Telehealth: Payer: Self-pay | Admitting: Internal Medicine

## 2023-11-03 DIAGNOSIS — Z13820 Encounter for screening for osteoporosis: Secondary | ICD-10-CM

## 2023-11-03 NOTE — Telephone Encounter (Signed)
Pt given codes for estrogen defiency and screening for osteoprosis

## 2023-11-03 NOTE — Telephone Encounter (Signed)
 Copied from CRM [id number]. Topic: General - Other >> Nov 03, 2023 12:11 PM Selinda RAMAN wrote: Reason for CRM: The patient called in stating her Bone Scan last year must have been coded wrong either by her previous provider or the Imaging Center as she states her insurance told her if it is coded as a preventative then the insurance would cover it. She states the codes listed last year stated her deductible wasn't met. She wanted to pass this information on to her provider. Please assist patient further.

## 2023-11-03 NOTE — Telephone Encounter (Signed)
 Please advise

## 2023-12-14 ENCOUNTER — Other Ambulatory Visit: Payer: Self-pay | Admitting: Emergency Medicine

## 2023-12-14 DIAGNOSIS — E1165 Type 2 diabetes mellitus with hyperglycemia: Secondary | ICD-10-CM

## 2023-12-14 MED ORDER — METFORMIN HCL ER 500 MG PO TB24: 500.0000 mg | ORAL_TABLET | Freq: Every day | ORAL | 1 refills | Status: DC

## 2023-12-17 ENCOUNTER — Ambulatory Visit
Admission: RE | Admit: 2023-12-17 | Discharge: 2023-12-17 | Disposition: A | Discharge status: Home / Self Care | Payer: No Typology Code available for payment source | Source: Ambulatory Visit | Attending: Internal Medicine | Admitting: Internal Medicine

## 2023-12-17 ENCOUNTER — Ambulatory Visit
Admission: RE | Admit: 2023-12-17 | Discharge: 2023-12-17 | Disposition: A | Discharge status: Home / Self Care | Payer: No Typology Code available for payment source | Source: Ambulatory Visit

## 2023-12-17 DIAGNOSIS — Z13820 Encounter for screening for osteoporosis: Secondary | ICD-10-CM | POA: Diagnosis present

## 2023-12-17 DIAGNOSIS — Z1231 Encounter for screening mammogram for malignant neoplasm of breast: Secondary | ICD-10-CM | POA: Insufficient documentation

## 2023-12-18 ENCOUNTER — Encounter: Payer: Self-pay | Admitting: Internal Medicine

## 2024-01-28 ENCOUNTER — Encounter: Payer: Self-pay | Admitting: Oncology

## 2024-01-28 ENCOUNTER — Ambulatory Visit (INDEPENDENT_AMBULATORY_CARE_PROVIDER_SITE_OTHER): Payer: Self-pay | Admitting: Internal Medicine

## 2024-01-28 ENCOUNTER — Encounter: Payer: Self-pay | Admitting: Internal Medicine

## 2024-01-28 VITALS — BP 118/72 | HR 83 | Temp 97.9°F | Resp 16 | Ht 64.0 in | Wt 144.7 lb

## 2024-01-28 DIAGNOSIS — Z7984 Long term (current) use of oral hypoglycemic drugs: Secondary | ICD-10-CM | POA: Diagnosis not present

## 2024-01-28 DIAGNOSIS — E782 Mixed hyperlipidemia: Secondary | ICD-10-CM | POA: Diagnosis not present

## 2024-01-28 DIAGNOSIS — E1165 Type 2 diabetes mellitus with hyperglycemia: Secondary | ICD-10-CM | POA: Diagnosis not present

## 2024-01-28 NOTE — Progress Notes (Signed)
 Established Patient Office Visit  Subjective    Patient ID: Paula Gray, female    DOB: [birth year]  Age: 68 y.o. MRN: [id number]  CC:  Chief Complaint  Patient presents with   Medical Management of Chronic Issues    HPI Paula Gray presents to follow up on chronic medical conditions.   Diabetes, Type 2: -Last A1c 6.1% 1/25 -Medications: Metformin  500 mg XR once daily -Patient is compliant with the above medications and reports no side effects.  -Checking BG at home: 90-125 -Exercise: 4 times a week - gym, rowing machine, weights and treadmill/cycling  -Eye exam: [facility] - UTD -Foot exam: UTD 1/25 -Microalbumin: Due -Statin: Yes -PNA vaccine: UTD -Denies symptoms of hypoglycemia, polyuria, polydipsia, numbness extremities, foot ulcers/trauma.   HLD: -Medications: Lipitor 10 mg 3x weekly due to leg cramps -Patient is compliant with above medications and reports no side effects.  -Last lipid panel: 4/24 TC 146, triglycerides 116, HDL 44, LDL 79  Celiac Disease: -Diagnosed about 5 years ago with endoscopy and labs -Now completely gluten free, will have severe nausea and vomiting with gluten -History of iron deficiency anemia as a consequence of celiac - had to have several iron infusions in the past -Hemoglobin, iron panel and ferritin normal in 4/24  Environmental Allergies: -Currently taking an over the counter anti-histamine, has allergies usually in the spring  Health Maintenance: -Blood work due -Mammogram 3/25 Birads-1  -DEXA 3/25 normal  -Colon cancer screening: colonoscopy 05/2019, repeat in 5 years   Outpatient Encounter Medications as of 01/28/2024  Medication Sig   Cholecalciferol (VITAMIN D3) 1000 units CAPS Take by mouth. With vitamin K   metFORMIN  (GLUCOPHAGE -XR) 500 MG 24 hr tablet Take 1 tablet (500 mg total) by mouth daily with breakfast. for diabetes.   rosuvastatin  (CRESTOR ) 10 MG tablet Take 10 mg 3 times weekly.   No  facility-administered encounter medications on file as of 01/28/2024.    Past Medical History:  Diagnosis Date   Arthritis    Celiac disease    Chickenpox    Hyperlipidemia    Migraine     Past Surgical History:  Procedure Laterality Date   CARPAL TUNNEL RELEASE  2006   CESAREAN SECTION  1985   COLONOSCOPY WITH PROPOFOL  N/A 06/24/2019   Procedure: COLONOSCOPY WITH PROPOFOL ;  Surgeon: Luke Salaam, MD;  Location: [facility] ENDOSCOPY;  Service: Gastroenterology;  Laterality: N/A;   RETINAL TEAR REPAIR CRYOTHERAPY Left 2015   TONSILLECTOMY  1962    Family History  Problem Relation Age of Onset   Arthritis Maternal Grandmother    Heart disease Maternal Grandfather    Arthritis Paternal Grandmother    Colon cancer Paternal Grandfather    Skin cancer Father    Breast cancer Other     Social History   Socioeconomic History   Marital status: Divorced    Spouse name: Not on file   Number of children: Not on file   Years of education: 2   Highest education level: Not on file  Occupational History   Not on file  Tobacco Use   Smoking status: Never    Passive exposure: Never   Smokeless tobacco: Never  Vaping Use   Vaping status: Never Used  Substance and Sexual Activity   Alcohol use: Yes    Alcohol/week: 1.0 standard drink of alcohol    Types: 1 Standard drinks or equivalent per week    Comment: social   Drug use: No   Sexual activity: Not  on file  Other Topics Concern   Not on file  Social History Narrative   Single.    2 children. 1 grandchild.   Works as a Tree surgeon.   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    Social Drivers of Corporate investment banker Strain: Low Risk  (01/20/2023)   Received from [facility]   Overall Financial Resource Strain (CARDIA)    Difficulty of Paying Living Expenses: Not hard at all  Food Insecurity: No Food Insecurity (01/20/2023)   Received from [facility]   Hunger Vital Sign    Worried  About Running Out of Food in the Last Year: Never true    Ran Out of Food in the Last Year: Never true  Transportation Needs: No Transportation Needs (01/20/2023)   Received from [facility] - Transportation    In the past 12 months, has lack of transportation kept you from medical appointments or from getting medications?: No    Lack of Transportation (Non-Medical): No  Physical Activity: Insufficiently Active (08/10/2023)   Received from [facility]   Exercise Vital Sign    Days of Exercise per Week: 3 days    Minutes of Exercise per Session: 30 min  Stress: Not on file  Social Connections: Not on file  Intimate Partner Violence: Not on file    Review of Systems  All other systems reviewed and are negative.       Objective    BP 118/72   Pulse 83   Temp 97.9 F (36.6 C) (Oral)   Resp 16   Ht 5\' 4"  (1.626 m)   Wt 144 lb 11.2 oz (65.6 kg)   SpO2 98%   BMI 24.84 kg/m   Physical Exam Constitutional:      Appearance: Normal appearance.  HENT:     Head: Normocephalic and atraumatic.  Eyes:     Conjunctiva/sclera: Conjunctivae normal.  Cardiovascular:     Rate and Rhythm: Normal rate and regular rhythm.  Pulmonary:     Effort: Pulmonary effort is normal.     Breath sounds: Normal breath sounds.  Skin:    General: Skin is warm and dry.  Neurological:     General: No focal deficit present.     Mental Status: She is alert. Mental status is at baseline.  Psychiatric:        Mood and Affect: Mood normal.        Behavior: Behavior normal.         Assessment & Plan:   Assessment & Plan  Type 2 diabetes mellitus Type 2 diabetes managed with metformin . No medication issues reported. - Continue metformin  as prescribed. - Recheck A1c with routine labs. - Urine microalbumin ordered.   Hyperlipidemia Hyperlipidemia managed with Crestor .  - Continue Crestor  as prescribed. - Recheck lipids.  - Inform provider of any  pharmacy or insurance changes.  - HgB A1c - CBC w/Diff/Platelet - COMPLETE METABOLIC PANEL WITHOUT GFR - Urine Microalbumin w/creat. ratio - Lipid Profile  Return in about 6 months (around 07/30/2024).   Rockney Cid, DO

## 2024-01-29 ENCOUNTER — Encounter: Payer: Self-pay | Admitting: Oncology

## 2024-01-29 ENCOUNTER — Encounter: Payer: Self-pay | Admitting: Internal Medicine

## 2024-01-29 LAB — COMPLETE METABOLIC PANEL WITHOUT GFR
AG Ratio: 2.2 (calc) (ref 1.0–2.5)
ALT: 13 U/L (ref 6–29)
AST: 15 U/L (ref 10–35)
Albumin: 4.8 g/dL (ref 3.6–5.1)
Alkaline phosphatase (APISO): 58 U/L (ref 37–153)
BUN: 18 mg/dL (ref 7–25)
CO2: 28 mmol/L (ref 20–32)
Calcium: 10 mg/dL (ref 8.6–10.4)
Chloride: 104 mmol/L (ref 98–110)
Creat: 0.86 mg/dL (ref 0.50–1.05)
Globulin: 2.2 g/dL (ref 1.9–3.7)
Glucose, Bld: 102 mg/dL — ABNORMAL HIGH (ref 65–99)
Potassium: 4.9 mmol/L (ref 3.5–5.3)
Sodium: 139 mmol/L (ref 135–146)
Total Bilirubin: 1 mg/dL (ref 0.2–1.2)
Total Protein: 7 g/dL (ref 6.1–8.1)

## 2024-01-29 LAB — CBC WITH DIFFERENTIAL/PLATELET
Absolute Lymphocytes: 2001 {cells}/uL (ref 850–3900)
Absolute Monocytes: 479 {cells}/uL (ref 200–950)
Basophils Absolute: 63 {cells}/uL (ref 0–200)
Basophils Relative: 1.1 %
Eosinophils Absolute: 262 {cells}/uL (ref 15–500)
Eosinophils Relative: 4.6 %
HCT: 43.2 % (ref 35.0–45.0)
Hemoglobin: 14.3 g/dL (ref 11.7–15.5)
MCH: 30.2 pg (ref 27.0–33.0)
MCHC: 33.1 g/dL (ref 32.0–36.0)
MCV: 91.3 fL (ref 80.0–100.0)
MPV: 10.5 fL (ref 7.5–12.5)
Monocytes Relative: 8.4 %
Neutro Abs: 2896 {cells}/uL (ref 1500–7800)
Neutrophils Relative %: 50.8 %
Platelets: 211 10*3/uL (ref 140–400)
RBC: 4.73 10*6/uL (ref 3.80–5.10)
RDW: 12.3 % (ref 11.0–15.0)
Total Lymphocyte: 35.1 %
WBC: 5.7 10*3/uL (ref 3.8–10.8)

## 2024-01-29 LAB — MICROALBUMIN / CREATININE URINE RATIO
Creatinine, Urine: 13 mg/dL — ABNORMAL LOW (ref 20–275)
Microalb, Ur: <0.2 mg/dL

## 2024-01-29 LAB — LIPID PANEL
Cholesterol: 179 mg/dL (ref <200)
HDL: 57 mg/dL (ref >=50)
LDL Cholesterol (Calc): 103 mg/dL — ABNORMAL HIGH
Non-HDL Cholesterol (Calc): 122 mg/dL (ref <130)
Total CHOL/HDL Ratio: 3.1 (calc) (ref <5.0)
Triglycerides: 93 mg/dL (ref <150)

## 2024-01-29 LAB — HEMOGLOBIN A1C
Hgb A1c MFr Bld: 6.1 % — ABNORMAL HIGH (ref <5.7)
Mean Plasma Glucose: 128 mg/dL
eAG (mmol/L): 7.1 mmol/L

## 2024-02-10 ENCOUNTER — Encounter: Payer: Self-pay | Admitting: Oncology

## 2024-04-15 ENCOUNTER — Other Ambulatory Visit: Payer: Self-pay | Admitting: Internal Medicine

## 2024-04-15 DIAGNOSIS — E782 Mixed hyperlipidemia: Secondary | ICD-10-CM

## 2024-04-15 NOTE — Telephone Encounter (Signed)
 Requested Prescriptions  Pending Prescriptions Disp Refills   rosuvastatin  (CRESTOR ) 10 MG tablet [Pharmacy Med Name: ROSUVASTATIN  CALCIUM  10 MG TAB] 36 tablet 3    Sig: TAKE 1 TABLET BY MOUTH 3 TIMES WEEKLY     Cardiovascular:  Antilipid - Statins 2 Failed - 04/15/2024  4:36 PM      Failed - Lipid Panel in normal range within the last 12 months    Cholesterol  Date Value Ref Range Status  01/28/2024 179 <200 mg/dL Final   LDL Cholesterol (Calc)  Date Value Ref Range Status  01/28/2024 103 (H) mg/dL (calc) Final    Comment:    Reference range: <100 . Desirable range <100 mg/dL for primary prevention;   <70 mg/dL for patients with CHD or diabetic patients  with > or = 2 CHD risk factors. SABRA LDL-C is now calculated using the Martin-Hopkins  calculation, which is a validated novel method providing  better accuracy than the Friedewald equation in the  estimation of LDL-C.  Gladis APPLETHWAITE et al. SANDREA. 7986;689(80): 2061-2068  (http://education.[facility].com/faq/FAQ164)    Direct LDL  Date Value Ref Range Status  05/23/2016 118.0 mg/dL Final    Comment:    Optimal:  <100 mg/dLNear or Above Optimal:  100-129 mg/dLBorderline High:  130-159 mg/dLHigh:  160-189 mg/dLVery High:  >190 mg/dL   HDL  Date Value Ref Range Status  01/28/2024 57 > OR = 50 mg/dL Final   Triglycerides  Date Value Ref Range Status  01/28/2024 93 <150 mg/dL Final         Passed - Cr in normal range and within 360 days    Creat  Date Value Ref Range Status  01/28/2024 0.86 0.50 - 1.05 mg/dL Final   Creatinine, Urine  Date Value Ref Range Status  01/28/2024 13 (L) 20 - 275 mg/dL Final         Passed - Patient is not pregnant      Passed - Valid encounter within last 12 months    Recent Outpatient Visits           2 months ago Type 2 diabetes mellitus with hyperglycemia, without long-term current use of insulin [facility])   [facility] Bernardo Fend, DO        Future Appointments             In 3 months Bernardo Fend, DO [employer] [facility]

## 2024-08-03 ENCOUNTER — Other Ambulatory Visit: Payer: Self-pay

## 2024-08-03 ENCOUNTER — Encounter: Payer: Self-pay | Admitting: Oncology

## 2024-08-03 ENCOUNTER — Encounter: Payer: Self-pay | Admitting: Internal Medicine

## 2024-08-03 ENCOUNTER — Ambulatory Visit: Admitting: Internal Medicine

## 2024-08-03 VITALS — BP 122/70 | HR 83 | Temp 97.9°F | Resp 16 | Ht 64.0 in | Wt 140.5 lb

## 2024-08-03 DIAGNOSIS — E782 Mixed hyperlipidemia: Secondary | ICD-10-CM

## 2024-08-03 DIAGNOSIS — Z7984 Long term (current) use of oral hypoglycemic drugs: Secondary | ICD-10-CM | POA: Diagnosis not present

## 2024-08-03 DIAGNOSIS — E1165 Type 2 diabetes mellitus with hyperglycemia: Secondary | ICD-10-CM | POA: Diagnosis not present

## 2024-08-03 LAB — POCT GLYCOSYLATED HEMOGLOBIN (HGB A1C): Hemoglobin A1C: 5.8 % — AB (ref 4.0–5.6)

## 2024-08-03 NOTE — Progress Notes (Signed)
 Established Patient Office Visit  Subjective    Patient ID: Paula Gray, female    DOB: 1955-11-21  Age: 68 y.o. MRN: [id number]  CC:  Chief Complaint  Patient presents with   Medical Management of Chronic Issues    6 month follow up    HPI Paula Gray presents to follow up on chronic medical conditions.   Discussed the use of AI scribe software for clinical note transcription with the patient, who gave verbal consent to proceed.  History of Present Illness Paula Gray is a 68 year old female with type 2 diabetes and hyperlipidemia who presents for a six-month checkup.  She manages her type 2 diabetes with metformin , taken once daily, and her recent A1c was 5.8. She has concerns about the manufacturing quality and potential carcinogenic risks of metformin  and is considering discontinuing it. She is open to more frequent A1c monitoring if she stops the medication.  She has hyperlipidemia and takes medication three times a week. Her cholesterol levels have improved, with decreased triglycerides and increased HDL. Despite these improvements, she is interested in possibly discontinuing her cholesterol medication. She follows a diet to help manage her cholesterol.  She had a colonoscopy five years ago and faces challenges in arranging transportation for a follow-up procedure. Her daughter, who recently started a new job, cannot assist with transportation, and she has no other family support. She has explored transportation assistance options but has not found a solution. She reports no issues with her current medications aside from her concerns about metformin .   Diabetes, Type 2: -Last A1c 6.1% 5/25 -Medications: Metformin  500 mg XR once daily -Patient is compliant with the above medications and reports no side effects but would like to discontinue medication and continue diet  -Exercise: 4 times a week - gym, rowing machine, weights and treadmill/cycling  -Eye  exam: [facility] - UTD -Foot exam: UTD 1/25 -Microalbumin: UTD -Statin: Yes -PNA vaccine: UTD -Denies symptoms of hypoglycemia, polyuria, polydipsia, numbness extremities, foot ulcers/trauma.   HLD: -Medications: Lipitor 10 mg 3x weekly due to leg cramps -Patient is compliant with above medications and reports no side effects.  -Last lipid panel: Lipid Panel     Component Value Date/Time   CHOL 179 01/28/2024 0959   TRIG 93 01/28/2024 0959   HDL 57 01/28/2024 0959   CHOLHDL 3.1 01/28/2024 0959   VLDL 34.6 01/14/2022 1200   LDLCALC 103 (H) 01/28/2024 0959   LDLDIRECT 118.0 05/23/2016 0859   Celiac Disease: -Diagnosed in 2020 with endoscopy and labs -Now completely gluten free, will have severe nausea and vomiting with gluten -History of iron deficiency anemia as a consequence of celiac - had to have several iron infusions in the past  Environmental Allergies: -Currently taking an over the counter anti-histamine, has allergies usually in the spring  Health Maintenance: -Blood work UTD -Mammogram 3/25 Birads-1  -DEXA 3/25 normal  -Colon cancer screening: colonoscopy 05/2019, repeat in 5 years   Outpatient Encounter Medications as of 08/03/2024  Medication Sig   Cholecalciferol (VITAMIN D3) 1000 units CAPS Take by mouth. With vitamin K   metFORMIN  (GLUCOPHAGE -XR) 500 MG 24 hr tablet Take 1 tablet (500 mg total) by mouth daily with breakfast. for diabetes.   rosuvastatin  (CRESTOR ) 10 MG tablet TAKE 1 TABLET BY MOUTH 3 TIMES WEEKLY   No facility-administered encounter medications on file as of 08/03/2024.    Past Medical History:  Diagnosis Date   Arthritis    Celiac disease  Chickenpox    Hyperlipidemia    Migraine     Past Surgical History:  Procedure Laterality Date   CARPAL TUNNEL RELEASE  2006   CESAREAN SECTION  1985   COLONOSCOPY WITH PROPOFOL  N/A 06/24/2019   Procedure: COLONOSCOPY WITH PROPOFOL ;  Surgeon: Therisa Bi, MD;  Location: [facility] ENDOSCOPY;   Service: Gastroenterology;  Laterality: N/A;   RETINAL TEAR REPAIR CRYOTHERAPY Left 2015   TONSILLECTOMY  1962    Family History  Problem Relation Age of Onset   Arthritis Maternal Grandmother    Heart disease Maternal Grandfather    Arthritis Paternal Grandmother    Colon cancer Paternal Grandfather    Skin cancer Father    Breast cancer Other     Social History   Socioeconomic History   Marital status: Divorced    Spouse name: Not on file   Number of children: Not on file   Years of education: 2   Highest education level: Associate degree: academic program  Occupational History   Not on file  Tobacco Use   Smoking status: Never    Passive exposure: Never   Smokeless tobacco: Never  Vaping Use   Vaping status: Never Used  Substance and Sexual Activity   Alcohol use: Yes    Alcohol/week: 1.0 standard drink of alcohol    Types: 1 Standard drinks or equivalent per week    Comment: social   Drug use: No   Sexual activity: Not on file  Other Topics Concern   Not on file  Social History Narrative   Single.    2 children. 1 grandchild.   Works as a Tree Surgeon.   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    Social Drivers of Corporate Investment Banker Strain: Low Risk  (07/31/2024)   Overall Financial Resource Strain (CARDIA)    Difficulty of Paying Living Expenses: Not hard at all  Food Insecurity: No Food Insecurity (07/31/2024)   Hunger Vital Sign    Worried About Running Out of Food in the Last Year: Never true    Ran Out of Food in the Last Year: Never true  Transportation Needs: Unmet Transportation Needs (07/31/2024)   PRAPARE - Administrator, Civil Service (Medical): Yes    Lack of Transportation (Non-Medical): No  Physical Activity: Sufficiently Active (07/31/2024)   Exercise Vital Sign    Days of Exercise per Week: 5 days    Minutes of Exercise per Session: 60 min  Stress: No Stress Concern Present (07/31/2024)   [employer] of  Occupational Health - Occupational Stress Questionnaire    Feeling of Stress: Only a little  Social Connections: Moderately Isolated (07/31/2024)   Social Connection and Isolation Panel    Frequency of Communication with Friends and Family: Three times a week    Frequency of Social Gatherings with Friends and Family: Once a week    Attends Religious Services: Never    Database Administrator or Organizations: Yes    Attends Engineer, Structural: More than 4 times per year    Marital Status: Divorced  Catering Manager Violence: Not on file    Review of Systems  All other systems reviewed and are negative.       Objective    BP 122/70 (Cuff Size: Large)   Pulse 83   Temp 97.9 F (36.6 C) (Oral)   Resp 16   Ht 5' 4 (1.626 m)   Wt 140 lb 8 oz (63.7 kg)  SpO2 98%   BMI 24.12 kg/m   Physical Exam Constitutional:      Appearance: Normal appearance.  HENT:     Head: Normocephalic and atraumatic.  Eyes:     Conjunctiva/sclera: Conjunctivae normal.  Cardiovascular:     Rate and Rhythm: Normal rate and regular rhythm.  Pulmonary:     Effort: Pulmonary effort is normal.     Breath sounds: Normal breath sounds.  Skin:    General: Skin is warm and dry.  Neurological:     General: No focal deficit present.     Mental Status: She is alert. Mental status is at baseline.  Psychiatric:        Mood and Affect: Mood normal.        Behavior: Behavior normal.         Assessment & Plan:   Assessment & Plan  Type 2 diabetes mellitus, diet controlled A1c improved to 5.8. Discussed potential A1c increase off metformin  but acceptable if under 7. - Discontinued metformin . - Scheduled follow-up A1c check in 3 months.  Hyperlipidemia Cholesterol improved with current regimen. LDL slightly elevated, HDL improved. Continued medication reduces cardiovascular risk. - Continue current cholesterol medication regimen three times a week.  General Health  Maintenance Colonoscopy due. Transportation issues discussed with potential child psychotherapist assistance. - Contacted child psychotherapist to explore transportation options for colonoscopy.  - POCT HgB A1C  Return in about 3 months (around 11/03/2024) for follow up on a1c.   Paula Fischer, DO

## 2024-08-04 ENCOUNTER — Encounter: Payer: Self-pay | Admitting: Internal Medicine

## 2024-08-05 MED ORDER — LANCETS MISC: 2 refills | Status: DC

## 2024-08-05 MED ORDER — CONTOUR MONITOR DEVI: 0 refills | Status: DC

## 2024-08-05 MED ORDER — CONTOUR PLUS TEST VI STRP: ORAL_STRIP | 12 refills | Status: DC

## 2024-08-10 ENCOUNTER — Encounter (INDEPENDENT_AMBULATORY_CARE_PROVIDER_SITE_OTHER): Payer: Self-pay

## 2024-08-15 ENCOUNTER — Other Ambulatory Visit: Payer: Self-pay

## 2024-08-15 DIAGNOSIS — E1165 Type 2 diabetes mellitus with hyperglycemia: Secondary | ICD-10-CM

## 2024-08-15 MED ORDER — CONTOUR MONITOR DEVI: 0 refills | Status: DC

## 2024-08-19 ENCOUNTER — Telehealth: Payer: Self-pay

## 2024-08-19 MED ORDER — LANCETS MISC: 2 refills | Status: DC

## 2024-08-19 MED ORDER — CONTOUR PLUS TEST VI STRP: ORAL_STRIP | 12 refills | Status: DC

## 2024-08-19 NOTE — Telephone Encounter (Signed)
 refill

## 2024-08-21 ENCOUNTER — Other Ambulatory Visit: Payer: Self-pay | Admitting: Internal Medicine

## 2024-08-22 ENCOUNTER — Telehealth: Payer: Self-pay

## 2024-08-22 ENCOUNTER — Other Ambulatory Visit: Payer: Self-pay

## 2024-08-22 DIAGNOSIS — E1165 Type 2 diabetes mellitus with hyperglycemia: Secondary | ICD-10-CM

## 2024-08-22 MED ORDER — CONTOUR PLUS TEST VI STRP: ORAL_STRIP | 12 refills | Status: DC

## 2024-08-22 MED ORDER — CONTOUR MONITOR DEVI: 0 refills | Status: AC

## 2024-08-22 MED ORDER — LANCETS MISC: 2 refills | Status: DC

## 2024-08-22 NOTE — Telephone Encounter (Signed)
 Copied from CRM #[id number]. Topic: Clinical - Prescription Issue >> Aug 22, 2024  1:03 PM Cleave MATSU wrote: Reason for CRM: [facility] stated that they are billing medication through medicare part B and they need doctor to send the new prescription in to say microlet lancets and contour next

## 2024-08-23 NOTE — Telephone Encounter (Signed)
 Requested medication (s) are due for refill today: yes  Requested medication (s) are on the active medication list: yes  Last refill:  08/22/24  Future visit scheduled: yes  Notes to clinic:    Pharmacy comment: Alternative Requested:PLEASE RESEND NEW RX FOR CONTOUR NEXT STRIPS. SHE IS GETTING A CONTOUR NEXT METER. TO SATISFY REQT FOR MEDICARE PART B THANKS.       Requested Prescriptions  Pending Prescriptions Disp Refills   CONTOUR PLUS TEST test strip [Pharmacy Med Name: CONTOUR PLUS TEST STRIP] 100 strip 12    Sig: DX: E11.65, CHECK BLOOD GLUCOSE DAILY     There is no refill protocol information for this order

## 2024-08-24 ENCOUNTER — Other Ambulatory Visit: Payer: Self-pay | Admitting: Emergency Medicine

## 2024-08-24 DIAGNOSIS — E1165 Type 2 diabetes mellitus with hyperglycemia: Secondary | ICD-10-CM

## 2024-08-24 MED ORDER — LANCETS MISC
2 refills | Status: DC
Start: 2024-08-24 — End: 2024-08-24

## 2024-08-24 MED ORDER — CONTOUR PLUS TEST VI STRP: ORAL_STRIP | 12 refills | Status: DC

## 2024-08-24 MED ORDER — LANCETS MISC: 2 refills | Status: AC

## 2024-08-24 NOTE — Telephone Encounter (Signed)
 The patient called in stating the pharmacy has told what the prescription specifically has to say but they keep getting the wrong thing sent in. It specifically needs to say microlet lancets 30 g and contour next test strips and it is not being sent in that way. The plus is obsolete and they don't even make that anymore.The patient is frustrated because she needs this. Please assist patient further and contact the patient as soon as possible to let her know this has been taken care of.

## 2024-08-24 NOTE — Telephone Encounter (Signed)
 New order sent with additional information that she requested

## 2024-08-30 ENCOUNTER — Encounter: Payer: Self-pay | Admitting: Internal Medicine

## 2024-08-31 MED ORDER — LANCETS 30G MISC: 2 refills | Status: AC

## 2024-08-31 MED ORDER — GLUCOSE BLOOD VI STRP: ORAL_STRIP | 12 refills | Status: AC

## 2024-09-07 ENCOUNTER — Ambulatory Visit: Payer: Self-pay | Admitting: Anesthesiology

## 2024-09-07 ENCOUNTER — Encounter: Payer: Self-pay | Admitting: Anesthesiology

## 2024-09-07 ENCOUNTER — Encounter: Admission: RE | Disposition: A | Payer: Self-pay | Source: Home / Self Care | Attending: Gastroenterology

## 2024-09-07 ENCOUNTER — Ambulatory Visit
Admission: RE | Admit: 2024-09-07 | Discharge: 2024-09-07 | Disposition: A | Discharge status: Home / Self Care | Attending: Gastroenterology | Admitting: Gastroenterology

## 2024-09-07 ENCOUNTER — Encounter: Payer: Self-pay | Admitting: Gastroenterology

## 2024-09-07 HISTORY — PX: POLYPECTOMY: SHX149

## 2024-09-07 HISTORY — PX: COLONOSCOPY: SHX5424

## 2024-09-07 SURGERY — COLONOSCOPY
Anesthesia: General

## 2024-09-07 MED ORDER — PROPOFOL 1000 MG/100ML IV EMUL
INTRAVENOUS | Status: AC
  Filled 2024-09-07: qty 100

## 2024-09-07 MED ORDER — LIDOCAINE HCL (PF) 2 % IJ SOLN
INTRAMUSCULAR | Status: AC
  Filled 2024-09-07: qty 15

## 2024-09-07 MED ORDER — LIDOCAINE HCL (CARDIAC) PF 100 MG/5ML IV SOSY
PREFILLED_SYRINGE | INTRAVENOUS | Status: DC | PRN
  Administered 2024-09-07: 60 mg via INTRAVENOUS

## 2024-09-07 MED ORDER — PROPOFOL 500 MG/50ML IV EMUL
INTRAVENOUS | Status: DC | PRN
  Administered 2024-09-07: 120 ug/kg/min via INTRAVENOUS

## 2024-09-07 MED ORDER — SODIUM CHLORIDE 0.9 % IV SOLN
INTRAVENOUS | Status: DC
  Administered 2024-09-07: 20 mL/h via INTRAVENOUS

## 2024-09-07 MED ORDER — PROPOFOL 10 MG/ML IV BOLUS
INTRAVENOUS | Status: DC | PRN
  Administered 2024-09-07: 60 mg via INTRAVENOUS

## 2024-09-07 NOTE — Anesthesia Preprocedure Evaluation (Addendum)
 Anesthesia Evaluation  Patient identified by MRN, date of birth, ID band Patient awake    Reviewed: Allergy & Precautions, H&P , NPO status , Patient's Chart, lab work & pertinent test results  Airway Mallampati: II  TM Distance: >3 FB Neck ROM: full    Dental no notable dental hx.    Pulmonary neg pulmonary ROS   Pulmonary exam normal        Cardiovascular negative cardio ROS Normal cardiovascular exam     Neuro/Psych negative neurological ROS  negative psych ROS   GI/Hepatic negative GI ROS, Neg liver ROS,,,  Endo/Other  diabetes    Renal/GU negative Renal ROS  negative genitourinary   Musculoskeletal  (+) Arthritis ,    Abdominal   Peds  Hematology negative hematology ROS (+)   Anesthesia Other Findings Past Medical History: No date: Arthritis No date: Celiac disease No date: Chickenpox No date: Hyperlipidemia No date: Migraine  Past Surgical History: 2006: CARPAL TUNNEL RELEASE 1985: CESAREAN SECTION 06/24/2019: COLONOSCOPY WITH PROPOFOL ; N/A     Comment:  Procedure: COLONOSCOPY WITH PROPOFOL ;  Surgeon: Therisa Bi, MD;  Location: [facility] ENDOSCOPY;  Service:               Gastroenterology;  Laterality: N/A; 2015: RETINAL TEAR REPAIR CRYOTHERAPY; Left 1962: TONSILLECTOMY  BMI    Body Mass Index: 23.52 kg/m      Reproductive/Obstetrics negative OB ROS                              Anesthesia Physical Anesthesia Plan  ASA: 2  Anesthesia Plan: General   Post-op Pain Management:    Induction: Intravenous  PONV Risk Score and Plan: Propofol  infusion and TIVA  Airway Management Planned: Natural Airway  Additional Equipment:   Intra-op Plan:   Post-operative Plan:   Informed Consent: I have reviewed the patients History and Physical, chart, labs and discussed the procedure including the risks, benefits and alternatives for the proposed anesthesia with  the patient or authorized representative who has indicated his/her understanding and acceptance.       Plan Discussed with: CRNA and Surgeon  Anesthesia Plan Comments:          Anesthesia Quick Evaluation

## 2024-09-07 NOTE — H&P (Signed)
 Paula [name] , MD [address], [address], [place], [place], [address] Phone: [phone] Fax: [phone]  Primary Care Physician:  Paula Fend, DO   Pre-Procedure History & Physical: HPI:  Paula [name] is a 68 y.o. female is here for an colonoscopy.   Past Medical History:  Diagnosis Date   Arthritis    Celiac disease    Chickenpox    Hyperlipidemia    Migraine     Past Surgical History:  Procedure Laterality Date   CARPAL TUNNEL RELEASE  2006   CESAREAN SECTION  1985   COLONOSCOPY WITH PROPOFOL  N/A 06/24/2019   Procedure: COLONOSCOPY WITH PROPOFOL ;  Surgeon: [name] Ruel, MD;  Location: [facility] ENDOSCOPY;  Service: Gastroenterology;  Laterality: N/A;   RETINAL TEAR REPAIR CRYOTHERAPY Left 2015   TONSILLECTOMY  1962    Prior to Admission medications   Medication Sig Start Date End Date Taking? Authorizing Provider  Blood Glucose Monitoring Suppl (CONTOUR MONITOR) DEVI Contour by Paula [name], checks glucose daily 08/22/24  Yes Paula Fend, DO  Cholecalciferol (VITAMIN D3) 1000 units CAPS Take by mouth. With vitamin K   Yes [provider]  glucose blood test strip (Contour Next strips) Dx E11.65 Check BG qd 08/31/24  Yes Paula Fend, DO  Lancets 30G MISC Contour next 30G lancets Dx E11.65, check BG qd 08/31/24  Yes Paula Fend, DO  Lancets MISC Check blood glucase daily Microlet Lancets 30g 08/24/24  Yes Paula [name], Paula F, FNP  rosuvastatin  (CRESTOR ) 10 MG tablet TAKE 1 TABLET BY MOUTH 3 TIMES WEEKLY 04/15/24  Yes Paula Fend, DO  metFORMIN  (GLUCOPHAGE -XR) 500 MG 24 hr tablet Take 1 tablet (500 mg total) by mouth daily with breakfast. for diabetes. Patient not taking: Reported on 09/07/2024 12/14/23   Paula Fend, DO    Allergies as of 08/10/2024 - Review Complete 08/03/2024  Allergen Reaction Noted   Iron dextran Palpitations 02/14/2015   Nsaids  06/24/2019   Prednisone Other (See Comments) 05/07/2016    Family  History  Problem Relation Age of Onset   Arthritis Maternal Grandmother    Heart disease Maternal Grandfather    Arthritis Paternal Grandmother    Colon cancer Paternal Grandfather    Skin cancer Father    Breast cancer Other     Social History   Socioeconomic History   Marital status: Divorced    Spouse name: Not on file   Number of children: Not on file   Years of education: 2   Highest education level: Associate degree: academic program  Occupational History   Not on file  Tobacco Use   Smoking status: Never    Passive exposure: Never   Smokeless tobacco: Never  Vaping Use   Vaping status: Never Used  Substance and Sexual Activity   Alcohol use: Yes    Alcohol/week: 1.0 standard drink of alcohol    Types: 1 Standard drinks or equivalent per week    Comment: social   Drug use: No   Sexual activity: Not on file  Other Topics Concern   Not on file  Social History Narrative   Single.    2 children. 1 grandchild.   Works as a Tree Surgeon.   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    Social Drivers of Corporate Investment Banker Strain: Low Risk  (07/31/2024)   Overall Financial Resource Strain (CARDIA)    Difficulty of Paying Living Expenses: Not hard at all  Food Insecurity: No Food Insecurity (07/31/2024)   Hunger  Vital Sign    Worried About Programme Researcher, Broadcasting/film/video in the Last Year: Never true    Ran Out of Food in the Last Year: Never true  Transportation Needs: Unmet Transportation Needs (07/31/2024)   PRAPARE - Administrator, Civil Service (Medical): Yes    Lack of Transportation (Non-Medical): No  Physical Activity: Sufficiently Active (07/31/2024)   Exercise Vital Sign    Days of Exercise per Week: 5 days    Minutes of Exercise per Session: 60 min  Stress: No Stress Concern Present (07/31/2024)   [employer] of Occupational Health - Occupational Stress Questionnaire    Feeling of Stress: Only a little  Social Connections: Moderately  Isolated (07/31/2024)   Social Connection and Isolation Panel    Frequency of Communication with Friends and Family: Three times a week    Frequency of Social Gatherings with Friends and Family: Once a week    Attends Religious Services: Never    Database Administrator or Organizations: Yes    Attends Engineer, Structural: More than 4 times per year    Marital Status: Divorced  Catering Manager Violence: Not on file    Review of Systems: See HPI, otherwise negative ROS  Physical Exam: BP 116/82   Pulse 81   Temp (!) 96.4 [name] (35.8 C) (Temporal)   Resp 20   Ht 5' 4 (1.626 m)   Wt 62.1 kg   SpO2 100%   BMI 23.52 kg/m  General:   [name],  pleasant and cooperative in NAD Head:  Normocephalic and atraumatic. Neck:  Supple; no masses or thyromegaly. Lungs:  Clear throughout to auscultation, normal respiratory effort.    Heart:  +S1, +S2, Regular rate and rhythm, No edema. Abdomen:  Soft, nontender and nondistended. Normal bowel sounds, without guarding, and without rebound.   Neurologic:  [name] and  oriented x4;  grossly normal neurologically.  Impression/Plan: Paula [name] is here for an colonoscopy to be performed for surveillance due to prior history of colon polyps   Risks, benefits, limitations, and alternatives regarding  colonoscopy have been reviewed with the patient.  Questions have been answered.  All parties agreeable.   Paula Kung, MD  09/07/2024, 7:40 AM

## 2024-09-07 NOTE — Op Note (Signed)
 [facility] Gastroenterology Patient Name: Paula [name] Procedure Date: 09/07/2024 6:57 AM MRN: [id number] Account #: [id number] Date of Birth: [birth year] Admit Type: Outpatient Age: 68 Room: [facility] ENDO ROOM 3 Gender: Female Note Status: Finalized Instrument Name: Colon Scope [id number] Procedure:             Colonoscopy Indications:           Surveillance: Personal history of adenomatous polyps                         on last colonoscopy > 3 years ago, Last colonoscopy:                         September 2020 Providers:             Ruel Kung MD, MD Referring MD:          Sharyle Fischer (Referring MD) Medicines:             Monitored Anesthesia Care Complications:         No immediate complications. Procedure:             Pre-Anesthesia Assessment:                        - Prior to the procedure, a History and Physical was                         performed, and patient medications, allergies and                         sensitivities were reviewed. The patient's tolerance                         of previous anesthesia was reviewed.                        - The risks and benefits of the procedure and the                         sedation options and risks were discussed with the                         patient. All questions were answered and informed                         consent was obtained.                        - ASA Grade Assessment: II - A patient with mild                         systemic disease.                        After obtaining informed consent, the colonoscope was                         passed under direct vision. Throughout the procedure,                         the patient's blood  pressure, pulse, and oxygen                         saturations were monitored continuously. The                         Colonoscope was introduced through the anus and                         advanced to the the cecum, identified by the                          appendiceal orifice. The colonoscopy was performed                         with ease. The patient tolerated the procedure well.                         The quality of the bowel preparation was excellent.                         The ileocecal valve, appendiceal orifice, and rectum                         were photographed. Findings:      The perianal and digital rectal examinations were normal.      Two sessile polyps were found in the cecum. The polyps were 4 to 5 mm in       size. These polyps were removed with a cold snare. Resection and       retrieval were complete.      The entire examined colon appeared normal.      Multiple medium-mouthed diverticula were found in the sigmoid colon.      The exam was otherwise without abnormality on direct and retroflexion       views. Impression:            - Two 4 to 5 mm polyps in the cecum, removed with a                         cold snare. Resected and retrieved.                        - The entire examined colon is normal.                        - Diverticulosis in the sigmoid colon.                        - The examination was otherwise normal on direct and                         retroflexion views. Recommendation:        - Discharge patient to home (with escort).                        - Resume previous diet.                        - Continue present medications.                        -  Await pathology results.                        - Repeat colonoscopy in 5 years for surveillance. Procedure Code(s):     --- Professional ---                        [phone], Colonoscopy, flexible; with removal of                         tumor(s), polyp(s), or other lesion(s) by snare                         technique Diagnosis Code(s):     --- Professional ---                        Z86.010, Personal history of colonic polyps                        D12.0, Benign neoplasm of cecum                        K57.30, Diverticulosis of large intestine without                          perforation or abscess without bleeding CPT copyright 2022 American Medical Association. All rights reserved. The codes documented in this report are preliminary and upon coder review may  be revised to meet current compliance requirements. Ruel Kung, MD Ruel Kung MD, MD 09/07/2024 8:05:19 AM This report has been signed electronically. Number of Addenda: 0 Note Initiated On: 09/07/2024 6:57 AM Scope Withdrawal Time: 0 hours 11 minutes 29 seconds  Total Procedure Duration: 0 hours 15 minutes 8 seconds  Estimated Blood Loss:  Estimated blood loss: none.      [facility]

## 2024-09-07 NOTE — Transfer of Care (Signed)
 Immediate Anesthesia Transfer of Care Note  Patient: Paula [name]  Procedure(s) Performed: COLONOSCOPY POLYPECTOMY, INTESTINE  Patient Location: PACU  Anesthesia Type:General  Level of Consciousness: drowsy and patient cooperative  Airway & Oxygen Therapy: Patient Spontanous Breathing  Post-op Assessment: Report given to RN and Post -op Vital signs reviewed and stable  Post vital signs: Reviewed and stable  Last Vitals:  Vitals Value Taken Time  BP 110/56   Temp    Pulse 85 09/07/24 08:06  Resp 24 09/07/24 08:06  SpO2 99 % 09/07/24 08:06  Vitals shown include unfiled device data.  Last Pain:  Vitals:   09/07/24 0654  TempSrc: Temporal  PainSc: 0-No pain         Complications: No notable events documented.

## 2024-09-08 LAB — SURGICAL PATHOLOGY

## 2024-09-08 NOTE — Anesthesia Postprocedure Evaluation (Signed)
 Anesthesia Post Note  Patient: Paula Gray  Procedure(s) Performed: COLONOSCOPY POLYPECTOMY, INTESTINE  Patient location during evaluation: Endoscopy Anesthesia Type: General Level of consciousness: awake and alert Pain management: pain level controlled Vital Signs Assessment: post-procedure vital signs reviewed and stable Respiratory status: spontaneous breathing, nonlabored ventilation and respiratory function stable Cardiovascular status: blood pressure returned to baseline and stable Postop Assessment: no apparent nausea or vomiting Anesthetic complications: no   No notable events documented.   Last Vitals:  Vitals:   09/07/24 0816 09/07/24 0827  BP: 102/63 120/76  Pulse: 77 73  Resp: 18 15  Temp: (!) 35.8 C (!) 35.8 C  SpO2: 99% 100%    Last Pain:  Vitals:   09/08/24 0749  TempSrc:   PainSc: 0-No pain                 Camellia Merilee Louder

## 2024-09-12 ENCOUNTER — Ambulatory Visit: Payer: Self-pay | Admitting: Gastroenterology

## 2024-11-02 ENCOUNTER — Other Ambulatory Visit: Payer: Self-pay | Admitting: Internal Medicine

## 2024-11-02 DIAGNOSIS — Z1231 Encounter for screening mammogram for malignant neoplasm of breast: Secondary | ICD-10-CM

## 2024-11-03 ENCOUNTER — Ambulatory Visit: Admitting: Internal Medicine

## 2024-11-03 ENCOUNTER — Encounter: Payer: Self-pay | Admitting: Internal Medicine

## 2024-11-03 VITALS — BP 120/76 | HR 87 | Temp 97.6°F | Resp 16 | Ht 64.0 in | Wt 143.3 lb

## 2024-11-03 DIAGNOSIS — E782 Mixed hyperlipidemia: Secondary | ICD-10-CM

## 2024-11-03 DIAGNOSIS — E1165 Type 2 diabetes mellitus with hyperglycemia: Secondary | ICD-10-CM

## 2024-11-03 LAB — POCT GLYCOSYLATED HEMOGLOBIN (HGB A1C): Hemoglobin A1C: 5.7 % — AB (ref 4.0–5.6)

## 2024-11-03 MED ORDER — ROSUVASTATIN CALCIUM 10 MG PO TABS: ORAL_TABLET | ORAL | 3 refills | Status: AC

## 2024-11-03 NOTE — Progress Notes (Signed)
 "  Established Patient Office Visit  Subjective    Patient ID: Paula Gray, female    DOB: Jun 12, 1956  Age: 69 y.o. MRN: [id number]  CC:  Chief Complaint  Patient presents with   Medical Management of Chronic Issues    HPI Paula Gray presents to follow up on chronic medical conditions.   Discussed the use of AI scribe software for clinical note transcription with the patient, who gave verbal consent to proceed.  History of Present Illness  Paula Gray is a 69 year old female who presents for routine follow-up.  Her A1c has improved over time, most recently 5.7 from 5.8 and 6.1. She has asked her pharmacy not to fill metformin  unless needed. She had a colonoscopy in December with removal of small polyps, pathology showing tubular adenoma. She is up to date on tetanus, flu, and COVID vaccines.   Diabetes, Type 2: -Last A1c 6.1% 5/25 -Medications: No longer on Metformin  and A1c continues to be well controlled.  -Patient is compliant with the above medications and reports no side effects but would like to discontinue medication and continue diet  -Exercise: 4 times a week - gym, rowing machine, weights and treadmill/cycling  -Eye exam: [facility] - UTD -Foot exam: Due at follow up -Microalbumin: UTD -Statin: Yes -PNA vaccine: UTD -Denies symptoms of hypoglycemia, polyuria, polydipsia, numbness extremities, foot ulcers/trauma.   HLD: -Medications: Crestor  10 mg 3x weekly due to leg cramps -Patient is compliant with above medications and reports no side effects.  -Last lipid panel: Lipid Panel     Component Value Date/Time   CHOL 179 01/28/2024 0959   TRIG 93 01/28/2024 0959   HDL 57 01/28/2024 0959   CHOLHDL 3.1 01/28/2024 0959   VLDL 34.6 01/14/2022 1200   LDLCALC 103 (H) 01/28/2024 0959   LDLDIRECT 118.0 05/23/2016 0859   Celiac Disease: -Diagnosed in 2020 with endoscopy and labs -Now completely gluten free, will have severe nausea and  vomiting with gluten -History of iron deficiency anemia as a consequence of celiac - had to have several iron infusions in the past  Environmental Allergies: -Currently taking an over the counter anti-histamine, has allergies usually in the spring  Health Maintenance: -Blood work UTD -Mammogram 3/25 Birads-1, scheduled for March -DEXA 3/25 normal  -Colon cancer screening: colonoscopy 12/25, repeat in 5 years    Outpatient Encounter Medications as of 11/03/2024  Medication Sig   Blood Glucose Monitoring Suppl (CONTOUR MONITOR) DEVI Contour by [facility], checks glucose daily   Cholecalciferol (VITAMIN D3) 1000 units CAPS Take by mouth. With vitamin K   glucose blood test strip (Contour Next strips) Dx E11.65 Check BG qd   Lancets 30G MISC Contour next 30G lancets Dx E11.65, check BG qd   Lancets MISC Check blood glucase daily Microlet Lancets 30g   rosuvastatin  (CRESTOR ) 10 MG tablet TAKE 1 TABLET BY MOUTH 3 TIMES WEEKLY   metFORMIN  (GLUCOPHAGE -XR) 500 MG 24 hr tablet Take 1 tablet (500 mg total) by mouth daily with breakfast. for diabetes. (Patient not taking: Reported on 11/03/2024)   No facility-administered encounter medications on file as of 11/03/2024.    Past Medical History:  Diagnosis Date   Arthritis    Celiac disease    Chickenpox    Hyperlipidemia    Migraine     Past Surgical History:  Procedure Laterality Date   CARPAL TUNNEL RELEASE  2006   CESAREAN SECTION  1985   COLONOSCOPY N/A 09/07/2024   Procedure: COLONOSCOPY;  Surgeon: Therisa Bi, MD;  Location: [facility] ENDOSCOPY;  Service: Gastroenterology;  Laterality: N/A;   COLONOSCOPY WITH PROPOFOL  N/A 06/24/2019   Procedure: COLONOSCOPY WITH PROPOFOL ;  Surgeon: Therisa Bi, MD;  Location: [facility] ENDOSCOPY;  Service: Gastroenterology;  Laterality: N/A;   POLYPECTOMY  09/07/2024   Procedure: POLYPECTOMY, INTESTINE;  Surgeon: Therisa Bi, MD;  Location: [facility] ENDOSCOPY;  Service: Gastroenterology;;   RETINAL TEAR REPAIR  CRYOTHERAPY Left 2015   TONSILLECTOMY  1962    Family History  Problem Relation Age of Onset   Arthritis Maternal Grandmother    Heart disease Maternal Grandfather    Arthritis Paternal Grandmother    Colon cancer Paternal Grandfather    Skin cancer Father    Breast cancer Other     Social History   Socioeconomic History   Marital status: Divorced    Spouse name: Not on file   Number of children: Not on file   Years of education: 2   Highest education level: Associate degree: academic program  Occupational History   Not on file  Tobacco Use   Smoking status: Never    Passive exposure: Never   Smokeless tobacco: Never  Vaping Use   Vaping status: Never Used  Substance and Sexual Activity   Alcohol use: Yes    Alcohol/week: 1.0 standard drink of alcohol    Types: 1 Standard drinks or equivalent per week    Comment: social   Drug use: No   Sexual activity: Not on file  Other Topics Concern   Not on file  Social History Narrative   Single.    2 children. 1 grandchild.   Works as a Tree Surgeon.   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    Social Drivers of Health   Tobacco Use: Low Risk (11/03/2024)   Patient History    Smoking Tobacco Use: Never    Smokeless Tobacco Use: Never    Passive Exposure: Never  Financial Resource Strain: Low Risk (07/31/2024)   Overall Financial Resource Strain (CARDIA)    Difficulty of Paying Living Expenses: Not hard at all  Food Insecurity: No Food Insecurity (07/31/2024)   Epic    Worried About Programme Researcher, Broadcasting/film/video in the Last Year: Never true    Ran Out of Food in the Last Year: Never true  Transportation Needs: Unmet Transportation Needs (07/31/2024)   Epic    Lack of Transportation (Medical): Yes    Lack of Transportation (Non-Medical): No  Physical Activity: Sufficiently Active (07/31/2024)   Exercise Vital Sign    Days of Exercise per Week: 5 days    Minutes of Exercise per Session: 60 min  Stress: No Stress Concern  Present (07/31/2024)   [employer] of Occupational Health - Occupational Stress Questionnaire    Feeling of Stress: Only a little  Social Connections: Moderately Isolated (07/31/2024)   Social Connection and Isolation Panel    Frequency of Communication with Friends and Family: Three times a week    Frequency of Social Gatherings with Friends and Family: Once a week    Attends Religious Services: Never    Database Administrator or Organizations: Yes    Attends Engineer, Structural: More than 4 times per year    Marital Status: Divorced  Intimate Partner Violence: Not on file  Depression (PHQ2-9): Low Risk (11/03/2024)   Depression (PHQ2-9)    PHQ-2 Score: 0  Alcohol Screen: Low Risk (07/31/2024)   Alcohol Screen    Last Alcohol Screening Score (AUDIT): 1  Housing: Low Risk (07/31/2024)   Epic    Unable to Pay for Housing in the Last Year: No    Number of Times Moved in the Last Year: 0    Homeless in the Last Year: No  Utilities: Not At Risk (01/20/2023)   Received from [facility]    Threatened with loss of utilities: No  Health Literacy: Not on file    Review of Systems  All other systems reviewed and are negative.       Objective    BP 120/76   Pulse 87   Temp 97.6 F (36.4 C) (Oral)   Resp 16   Ht 5' 4 (1.626 m)   Wt 143 lb 4.8 oz (65 kg)   SpO2 99%   BMI 24.60 kg/m   Physical Exam Constitutional:      Appearance: Normal appearance.  HENT:     Head: Normocephalic and atraumatic.  Eyes:     Conjunctiva/sclera: Conjunctivae normal.  Cardiovascular:     Rate and Rhythm: Normal rate and regular rhythm.  Pulmonary:     Effort: Pulmonary effort is normal.     Breath sounds: Normal breath sounds.  Skin:    General: Skin is warm and dry.  Neurological:     General: No focal deficit present.     Mental Status: She is alert. Mental status is at baseline.  Psychiatric:        Mood and Affect: Mood normal.         Behavior: Behavior normal.         Assessment & Plan:   Assessment & Plan  Type 2 diabetes mellitus w/Hyperglycemia  Well-controlled with A1c of 5.7. Discontinued metformin , maintaining glycemic control. - Discontinued metformin . - Schedule labs in six months for kidney function and cholesterol levels.  Hyperlipidemia Stable, doing well on statin, refilled.  - Plan to recheck fasting labs at follow up.   General Health Maintenance Mammogram scheduled. Colonoscopy showed small polyps and tubular adenoma; follow-up in five years. Vaccinations current. - Proceed with scheduled mammogram on March 25th. - No bone scan needed this year. - No further colonoscopy needed unless future findings indicate. - Ensure tetanus vaccine remains up to date.  - POCT glycosylated hemoglobin (Hb A1C) - rosuvastatin  (CRESTOR ) 10 MG tablet; TAKE 1 TABLET BY MOUTH 3 TIMES WEEKLY  Dispense: 36 tablet; Refill: 3   Return in about 6 months (around 05/03/2025).   Sharyle Fischer, DO  "

## 2024-12-21 ENCOUNTER — Encounter

## 2025-05-03 ENCOUNTER — Ambulatory Visit: Admitting: Internal Medicine
